# Patient Record
Sex: Male | Born: 1955 | Race: White | Hispanic: No | Marital: Married | Smoking: Never smoker
Health system: Southern US, Community
[De-identification: ages and names within clinical notes are randomized; demographics above are authoritative.]

## PROBLEM LIST (undated history)

## (undated) DIAGNOSIS — E042 Nontoxic multinodular goiter: Secondary | ICD-10-CM

## (undated) DIAGNOSIS — E041 Nontoxic single thyroid nodule: Secondary | ICD-10-CM

## (undated) DIAGNOSIS — C801 Malignant (primary) neoplasm, unspecified: Secondary | ICD-10-CM

## (undated) DIAGNOSIS — D869 Sarcoidosis, unspecified: Secondary | ICD-10-CM

## (undated) DIAGNOSIS — M1990 Unspecified osteoarthritis, unspecified site: Secondary | ICD-10-CM

## (undated) DIAGNOSIS — Z86718 Personal history of other venous thrombosis and embolism: Secondary | ICD-10-CM

## (undated) DIAGNOSIS — Z9109 Other allergy status, other than to drugs and biological substances: Secondary | ICD-10-CM

## (undated) DIAGNOSIS — R519 Headache, unspecified: Secondary | ICD-10-CM

## (undated) DIAGNOSIS — G4730 Sleep apnea, unspecified: Secondary | ICD-10-CM

## (undated) DIAGNOSIS — R05 Cough: Secondary | ICD-10-CM

## (undated) DIAGNOSIS — I10 Essential (primary) hypertension: Secondary | ICD-10-CM

## (undated) DIAGNOSIS — E119 Type 2 diabetes mellitus without complications: Secondary | ICD-10-CM

## (undated) DIAGNOSIS — L4050 Arthropathic psoriasis, unspecified: Secondary | ICD-10-CM

## (undated) DIAGNOSIS — R51 Headache: Secondary | ICD-10-CM

## (undated) DIAGNOSIS — R059 Cough, unspecified: Secondary | ICD-10-CM

## (undated) DIAGNOSIS — Q385 Congenital malformations of palate, not elsewhere classified: Secondary | ICD-10-CM

## (undated) HISTORY — DX: Arthropathic psoriasis, unspecified: L40.50

## (undated) HISTORY — PX: ANKLE SURGERY: SHX546

## (undated) HISTORY — PX: NASAL RECONSTRUCTION: SHX2069

## (undated) HISTORY — DX: Other allergy status, other than to drugs and biological substances: Z91.09

## (undated) SURGERY — BRONCHOSCOPY, WITH FLUOROSCOPY
Anesthesia: General

---

## 2008-04-21 ENCOUNTER — Emergency Department: Payer: Self-pay | Admitting: Emergency Medicine

## 2008-05-04 ENCOUNTER — Emergency Department: Payer: Self-pay | Admitting: Emergency Medicine

## 2008-12-19 ENCOUNTER — Inpatient Hospital Stay: Payer: Self-pay | Admitting: Internal Medicine

## 2015-05-22 ENCOUNTER — Encounter: Payer: Self-pay | Admitting: Radiology

## 2015-05-22 ENCOUNTER — Ambulatory Visit
Admission: RE | Admit: 2015-05-22 | Discharge: 2015-05-22 | Disposition: A | Discharge status: Home / Self Care | Payer: PRIVATE HEALTH INSURANCE | Source: Ambulatory Visit | Attending: Internal Medicine | Admitting: Internal Medicine

## 2015-05-22 ENCOUNTER — Other Ambulatory Visit: Payer: Self-pay | Admitting: Internal Medicine

## 2015-05-22 DIAGNOSIS — R0600 Dyspnea, unspecified: Secondary | ICD-10-CM

## 2015-05-22 DIAGNOSIS — R911 Solitary pulmonary nodule: Secondary | ICD-10-CM | POA: Diagnosis not present

## 2015-05-22 DIAGNOSIS — K8020 Calculus of gallbladder without cholecystitis without obstruction: Secondary | ICD-10-CM | POA: Diagnosis not present

## 2015-05-22 DIAGNOSIS — R05 Cough: Secondary | ICD-10-CM | POA: Diagnosis present

## 2015-05-22 DIAGNOSIS — R059 Cough, unspecified: Secondary | ICD-10-CM

## 2015-05-22 DIAGNOSIS — R590 Localized enlarged lymph nodes: Secondary | ICD-10-CM | POA: Insufficient documentation

## 2015-05-22 HISTORY — DX: Essential (primary) hypertension: I10

## 2015-05-22 MED ORDER — IOHEXOL 350 MG/ML SOLN
100.0000 mL | Freq: Once | INTRAVENOUS | Status: AC | PRN
  Administered 2015-05-22: 100 mL via INTRAVENOUS

## 2015-06-03 ENCOUNTER — Other Ambulatory Visit: Payer: Self-pay | Admitting: Internal Medicine

## 2015-06-03 DIAGNOSIS — R591 Generalized enlarged lymph nodes: Secondary | ICD-10-CM

## 2015-06-03 DIAGNOSIS — R634 Abnormal weight loss: Secondary | ICD-10-CM

## 2015-06-03 DIAGNOSIS — R599 Enlarged lymph nodes, unspecified: Secondary | ICD-10-CM

## 2015-06-06 ENCOUNTER — Encounter: Payer: Self-pay | Admitting: Pulmonary Disease

## 2015-06-06 ENCOUNTER — Ambulatory Visit (INDEPENDENT_AMBULATORY_CARE_PROVIDER_SITE_OTHER): Payer: PRIVATE HEALTH INSURANCE | Admitting: Pulmonary Disease

## 2015-06-06 ENCOUNTER — Other Ambulatory Visit (INDEPENDENT_AMBULATORY_CARE_PROVIDER_SITE_OTHER): Payer: PRIVATE HEALTH INSURANCE

## 2015-06-06 VITALS — BP 136/68 | HR 101 | Ht 68.0 in | Wt 194.0 lb

## 2015-06-06 DIAGNOSIS — R938 Abnormal findings on diagnostic imaging of other specified body structures: Secondary | ICD-10-CM | POA: Diagnosis not present

## 2015-06-06 DIAGNOSIS — R9389 Abnormal findings on diagnostic imaging of other specified body structures: Secondary | ICD-10-CM | POA: Insufficient documentation

## 2015-06-06 DIAGNOSIS — R05 Cough: Secondary | ICD-10-CM | POA: Diagnosis not present

## 2015-06-06 DIAGNOSIS — R059 Cough, unspecified: Secondary | ICD-10-CM

## 2015-06-06 LAB — CBC WITH DIFFERENTIAL/PLATELET
BASOS ABS: 0 10*3/uL (ref 0.0–0.1)
BASOS PCT: 0.2 % (ref 0.0–3.0)
EOS ABS: 0.2 10*3/uL (ref 0.0–0.7)
Eosinophils Relative: 3 % (ref 0.0–5.0)
HCT: 43.9 % (ref 39.0–52.0)
HEMOGLOBIN: 14.9 g/dL (ref 13.0–17.0)
Lymphocytes Relative: 13.4 % (ref 12.0–46.0)
Lymphs Abs: 1 10*3/uL (ref 0.7–4.0)
MCHC: 33.9 g/dL (ref 30.0–36.0)
MCV: 83.9 fl (ref 78.0–100.0)
MONO ABS: 1.1 10*3/uL — AB (ref 0.1–1.0)
Monocytes Relative: 14.3 % — ABNORMAL HIGH (ref 3.0–12.0)
NEUTROS ABS: 5.3 10*3/uL (ref 1.4–7.7)
Neutrophils Relative %: 69.1 % (ref 43.0–77.0)
PLATELETS: 239 10*3/uL (ref 150.0–400.0)
RBC: 5.23 Mil/uL (ref 4.22–5.81)
RDW: 14.4 % (ref 11.5–15.5)
WBC: 7.7 10*3/uL (ref 4.0–10.5)

## 2015-06-06 NOTE — Patient Instructions (Signed)
We will arrange for a bronchoscopy on Monday 11:30 at [facility] Do not eat anything after midnight on Sunday Keep taking your cough medications as you're doing We will see you back in 4-6 weeks or sooner if needed

## 2015-06-06 NOTE — Progress Notes (Signed)
Subjective:    Patient ID: Jimmy Macdonald, male    DOB: [birth year], 59 y.o.   MRN: [id number]  HPI Chief Complaint  Patient presents with  . Advice Only    Self referral for prod cough with clear/white mucus, unexplained weight loss X4 mos.  s/s presented after helping son move some rock/dirt into a chicken enclosement.     Cough started in July.  He was working with his son and cleaning out his chicken house.  This lead to a cough.  He started with just an occasional cough which was every once in a while but it has progressed since then. He has been on Augmentin twice, levaquin, and prednisone a couple of times.  He has had progressive symptoms of cough.    He ended up in the ER at [facility] having a CT scan of his chest.  He had a pulmonary nodule.  Since then he was treated with another round of antibiotics but this hasn't helped.  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  He still has a severe cough and takes tussionex at night and continues to cough.  He feels fatigued nearly all the time.  He sometimes coughs up hard chunks of mucus.  He also feels like he has a low grade fever nearly all the time.  He has a low grade headache a lot.    Prednisone helped his energy level a little.  He has no GI complaints.  No energy.  He has no appetite right now.    He has psoriatic arthritis and has been on humira for that.  His dose has been reduced recently.  He started it in 5 years ago. He has not taken the humira since July however when he had the cough.   Past Medical History  Diagnosis Date  . Hypertension   . Environmental allergies   . Psoriatic arthritis (HCC)      Family History  Problem Relation Age of Onset  . Hypercholesterolemia Brother   . Lung disease Mother      Social History   Social History  . Marital Status: Married    Spouse Name: N/A  . Number of Children: N/A  . Years of Education: N/A   Occupational History  . Not on file.   Social History  Main Topics  . Smoking status: Never Smoker   . Smokeless tobacco: Never Used  . Alcohol Use: Not on file  . Drug Use: Not on file  . Sexual Activity: Not on file   Other Topics Concern  . Not on file   Social History Narrative     Allergies  Allergen Reactions  . Ibuprofen Rash     No outpatient prescriptions prior to visit.   No facility-administered medications prior to visit.        Review of Systems  Constitutional: Positive for unexpected weight change. Negative for fever.  HENT: Positive for congestion and sinus pressure. Negative for dental problem, ear pain, nosebleeds, postnasal drip, rhinorrhea, sneezing, sore throat and trouble swallowing.   Eyes: Negative for redness and itching.  Respiratory: Positive for cough and shortness of breath. Negative for chest tightness and wheezing.   Cardiovascular: Negative for palpitations and leg swelling.  Gastrointestinal: Negative for nausea and vomiting.  Genitourinary: Negative for dysuria.  Musculoskeletal: Negative for joint swelling.  Skin: Negative for rash.  Neurological: Positive for headaches.  Hematological: Does not bruise/bleed easily.  Psychiatric/Behavioral: Negative for dysphoric mood. The  patient is not nervous/anxious.        Objective:   Physical Exam Filed Vitals:   06/06/15 1537  BP: 136/68  Pulse: 101  Height: 5\' 8"  (1.727 m)  Weight: 194 lb (87.998 kg)  SpO2: 96%   RA  Gen: well appearing, no acute distress HENT: NCAT, OP clear, neck supple without masses Eyes: PERRL, EOMi Lymph: no cervical lymphadenopathy PULM: Crackles R base B CV: RRR, no mgr, no JVD GI: BS+, soft, nontender, no hsm Derm: no rash or skin breakdown MSK: normal bulk and tone Neuro: A&Ox4, CN II-XII intact, strength 5/5 in all 4 extremities Psyche: normal mood and affect   CT scan images personally reviewed, see my description below Records from his primary care physician were reviewed including his medication  list as detailed above.     Assessment & Plan:  Abnormal CT scan, chest Mr. Darnelle SpangleGrinstead has a CT scan which shows a pulmonary nodule in the right lower lobe, mediastinal lymphadenopathy, and dependent appearing groundglass. This has been associated with a cough ever since when he was exposed to the chicken coop in July. Since then he's had off and on exposure to it as it is in his yard. I am concerned considering his immunocompromise state this represents an infectious syndrome of some sort. Clearly, most bacteria have been treated multiple times with his 5-6 rounds of various antibiotics since July. I'm more concerned about the possibility of an atypical fungal infection or mycobacterial infection considering the fact that he uses Humira for his psoriatic arthritis. There is also a possibility that this may be an inflammatory condition as he has backed off on the Humira in the last several months. Specifically, hypersensitivity pneumonitis comes to mind versus another nonspecific interstitial pneumonitis. He has a low-grade fever which is nonspecific and could be associated with both a systemic infection versus an inflammatory state. So in summary the differential diagnosis here is quite broad.  Plan: Blood work today to look for evidence of infection (serum cryptococcal antigen, urine histo antigen) connective tissue disease panel, hypersensitivity panel Plan [facility] we will send for an extended immunocompromised infectious panel (AFB, fungal, bacterial, viral, PCP) and we will send a cell count with differential and cytology CBC with differential and serum IgE today   Cough As detailed above. Clearly lisinopril is not helping. He has are had a trial off of that. For now we'll maintain the lisinopril.  however, do not think it's helping. For now we will assess with the CT scan as detailed above.      Current outpatient prescriptions:  .  aspirin 81 MG  tablet, Take 81 mg by mouth daily., Disp: , Rfl:  .  chlorpheniramine-HYDROcodone (TUSSIONEX PENNKINETIC ER) 10-8 MG/5ML SUER, Take 5 mLs by mouth at bedtime as needed for cough., Disp: , Rfl:  .  levofloxacin (LEVAQUIN) 750 MG tablet, Take 750 mg by mouth daily., Disp: , Rfl:  .  lisinopril-hydrochlorothiazide (PRINZIDE,ZESTORETIC) 20-12.5 MG tablet, Take 1 tablet by mouth daily., Disp: , Rfl:  .  loratadine (CLARITIN) 10 MG tablet, Take 10 mg by mouth daily., Disp: , Rfl:  .  traMADol (ULTRAM) 50 MG tablet, Take 50 mg by mouth daily as needed., Disp: , Rfl:

## 2015-06-06 NOTE — Assessment & Plan Note (Signed)
[name] has a CT scan which shows a pulmonary nodule in the right lower lobe, mediastinal lymphadenopathy, and dependent appearing groundglass. This has been associated with a cough ever since when he was exposed to the chicken coop in July. Since then he's had off and on exposure to it as it is in his yard. I am concerned considering his immunocompromise state this represents an infectious syndrome of some sort. Clearly, most bacteria have been treated multiple times with his 5-6 rounds of various antibiotics since July. I'm more concerned about the possibility of an atypical fungal infection or mycobacterial infection considering the fact that he uses Humira for his psoriatic arthritis. There is also a possibility that this may be an inflammatory condition as he has backed off on the Humira in the last several months. Specifically, hypersensitivity pneumonitis comes to mind versus another nonspecific interstitial pneumonitis. He has a low-grade fever which is nonspecific and could be associated with both a systemic infection versus an inflammatory state. So in summary the differential diagnosis here is quite broad.  Plan: Blood work today to look for evidence of infection (serum cryptococcal antigen, urine histo antigen) connective tissue disease panel, hypersensitivity panel Plan [facility] we will send for an extended immunocompromised infectious panel (AFB, fungal, bacterial, viral, PCP) and we will send a cell count with differential and cytology CBC with differential and serum IgE today

## 2015-06-06 NOTE — Assessment & Plan Note (Signed)
As detailed above. Clearly lisinopril is not helping. He has are had a trial off of that. For now we'll maintain the lisinopril.  however, do not think it's helping. For now we will assess with the CT scan as detailed above.

## 2015-06-07 ENCOUNTER — Telehealth: Payer: Self-pay

## 2015-06-07 LAB — SJOGRENS SYNDROME-B EXTRACTABLE NUCLEAR ANTIBODY: SSB (La) (ENA) Antibody, IgG: <1

## 2015-06-07 LAB — SJOGRENS SYNDROME-A EXTRACTABLE NUCLEAR ANTIBODY: SSA (Ro) (ENA) Antibody, IgG: <1

## 2015-06-07 LAB — ANCA SCREEN W REFLEX TITER: ANCA Screen: NEGATIVE

## 2015-06-07 LAB — IGE: IgE (Immunoglobulin E), Serum: 251 kU/L — ABNORMAL HIGH (ref <115)

## 2015-06-07 NOTE — Telephone Encounter (Signed)
Spoke with BQ this morning, states that the bronchoscopy that was schedule with pt for [facility] at 12[facility]00 needs to be cancelled and changed to an [facility].  This has already been scheduled for Thursday 06/13/2015 at 7[facility].    lmtcb X1 on home # to make pt aware.   Spoke with pt on work #, he is aware of change in procedure date/time/location.  Nothing further needed.

## 2015-06-10 ENCOUNTER — Encounter (HOSPITAL_COMMUNITY): Admission: RE | Payer: Self-pay | Source: Ambulatory Visit

## 2015-06-10 ENCOUNTER — Ambulatory Visit (HOSPITAL_COMMUNITY)
Admission: RE | Admit: 2015-06-10 | Payer: PRIVATE HEALTH INSURANCE | Source: Ambulatory Visit | Admitting: Pulmonary Disease

## 2015-06-10 ENCOUNTER — Inpatient Hospital Stay (HOSPITAL_COMMUNITY): Admission: RE | Admit: 2015-06-10 | Payer: PRIVATE HEALTH INSURANCE | Source: Ambulatory Visit

## 2015-06-10 SURGERY — BRONCHOSCOPY, WITH FLUOROSCOPY
Anesthesia: Moderate Sedation | Laterality: Bilateral

## 2015-06-12 ENCOUNTER — Encounter (HOSPITAL_COMMUNITY)
Admission: RE | Admit: 2015-06-12 | Discharge: 2015-06-12 | Disposition: A | Discharge status: Home / Self Care | Payer: PRIVATE HEALTH INSURANCE | Source: Ambulatory Visit | Attending: Pulmonary Disease | Admitting: Pulmonary Disease

## 2015-06-12 ENCOUNTER — Encounter (HOSPITAL_COMMUNITY): Payer: Self-pay

## 2015-06-12 ENCOUNTER — Other Ambulatory Visit: Payer: Self-pay

## 2015-06-12 DIAGNOSIS — J8410 Pulmonary fibrosis, unspecified: Secondary | ICD-10-CM | POA: Diagnosis not present

## 2015-06-12 DIAGNOSIS — I10 Essential (primary) hypertension: Secondary | ICD-10-CM | POA: Diagnosis not present

## 2015-06-12 DIAGNOSIS — R590 Localized enlarged lymph nodes: Secondary | ICD-10-CM | POA: Diagnosis present

## 2015-06-12 DIAGNOSIS — M1990 Unspecified osteoarthritis, unspecified site: Secondary | ICD-10-CM | POA: Diagnosis not present

## 2015-06-12 DIAGNOSIS — L4050 Arthropathic psoriasis, unspecified: Secondary | ICD-10-CM | POA: Diagnosis not present

## 2015-06-12 HISTORY — DX: Headache, unspecified: R51.9

## 2015-06-12 HISTORY — DX: Cough: R05

## 2015-06-12 HISTORY — DX: Headache: R51

## 2015-06-12 HISTORY — DX: Personal history of other venous thrombosis and embolism: Z86.718

## 2015-06-12 HISTORY — DX: Cough, unspecified: R05.9

## 2015-06-12 HISTORY — DX: Unspecified osteoarthritis, unspecified site: M19.90

## 2015-06-12 HISTORY — DX: Nontoxic single thyroid nodule: E04.1

## 2015-06-12 LAB — CBC
HEMATOCRIT: 42 % (ref 39.0–52.0)
HEMOGLOBIN: 14.3 g/dL (ref 13.0–17.0)
MCH: 28.7 pg (ref 26.0–34.0)
MCHC: 34 g/dL (ref 30.0–36.0)
MCV: 84.2 fL (ref 78.0–100.0)
Platelets: 220 10*3/uL (ref 150–400)
RBC: 4.99 MIL/uL (ref 4.22–5.81)
RDW: 13.6 % (ref 11.5–15.5)
WBC: 6.5 10*3/uL (ref 4.0–10.5)

## 2015-06-12 LAB — BASIC METABOLIC PANEL
Anion gap: 11 (ref 5–15)
BUN: 12 mg/dL (ref 6–20)
CALCIUM: 9.6 mg/dL (ref 8.9–10.3)
CO2: 27 mmol/L (ref 22–32)
CREATININE: 0.87 mg/dL (ref 0.61–1.24)
Chloride: 99 mmol/L — ABNORMAL LOW (ref 101–111)
GFR calc non Af Amer: >60 mL/min (ref >60)
Glucose, Bld: 134 mg/dL — ABNORMAL HIGH (ref 65–99)
Potassium: 4.5 mmol/L (ref 3.5–5.1)
SODIUM: 137 mmol/L (ref 135–145)

## 2015-06-12 NOTE — Progress Notes (Signed)
SPOKE WITH LIBBY AT DR. Corey SkainsMCQUAIDS OFFICE RE:NOT HAVING ORDERS.  PAGED DR. Kendrick FriesMCQUAID, NO ANSWER.

## 2015-06-12 NOTE — Pre-Procedure Instructions (Signed)
Jimmy RushingBarry L Macdonald  06/12/2015      [facility] [place], [place] - [address] [place] [id number] Phone: [phone] Fax: [phone]    Your procedure is scheduled on    Thursday  06/13/15  [facility] Admitting at 530 A.M.  Call this number if you have problems the morning of surgery:  [phone]   Remember:  Do not eat food or drink liquids after midnight.  Take these medicines the morning of surgery with A SIP OF WATER   NONE  (STOP ASPIRIN)   Do not wear jewelry, make-up or nail polish.  Do not wear lotions, powders, or perfumes.  You may wear deodorant.  Do not shave 48 hours prior to surgery.  Men may shave face and neck.  Do not bring valuables to the hospital.  [facility] is not responsible for any belongings or valuables.  Contacts, dentures or bridgework may not be worn into surgery.  Leave your suitcase in the car.  After surgery it may be brought to your room.  For patients admitted to the hospital, discharge time will be determined by your treatment team.  Patients discharged the day of surgery will not be allowed to drive home.   Name and phone number of your driver:   SPOUSE Special instructions:   [place] - Preparing for Surgery  Before surgery, you can play an important role.  Because skin is not sterile, your skin needs to be as free of germs as possible.  You can reduce the number of germs on you skin by washing with CHG (chlorahexidine gluconate) soap before surgery.  CHG is an antiseptic cleaner which kills germs and bonds with the skin to continue killing germs even after washing.  Please DO NOT use if you have an allergy to CHG or antibacterial soaps.  If your skin becomes reddened/irritated stop using the CHG and inform your nurse when you arrive at Short Stay.  Do not shave (including legs and underarms) for at least 48 hours prior to the first CHG shower.  You may shave your face.  Please follow  these instructions carefully:   1.  Shower with CHG Soap the night before surgery and the                                morning of Surgery.  2.  If you choose to wash your hair, wash your hair first as usual with your       normal shampoo.  3.  After you shampoo, rinse your hair and body thoroughly to remove the                      Shampoo.  4.  Use CHG as you would any other liquid soap.  You can apply chg directly       to the skin and wash gently with scrungie or a clean washcloth.  5.  Apply the CHG Soap to your body ONLY FROM THE NECK DOWN.        Do not use on open wounds or open sores.  Avoid contact with your eyes,       ears, mouth and genitals (private parts).  Wash genitals (private parts)       with your normal soap.  6.  Wash thoroughly, paying special attention to the area where your  surgery        will be performed.  7.  Thoroughly rinse your body with warm water from the neck down.  8.  DO NOT shower/wash with your normal soap after using and rinsing off       the CHG Soap.  9.  Pat yourself dry with a clean towel.            10.  Wear clean pajamas.            11.  Place clean sheets on your bed the night of your first shower and do not        sleep with pets.  Day of Surgery  Do not apply any lotions/deoderants the morning of surgery.  Please wear clean clothes to the hospital/surgery center.    Please read over the following fact sheets that you were given. Pain Booklet, Coughing and Deep Breathing and Surgical Site Infection Prevention

## 2015-06-13 ENCOUNTER — Ambulatory Visit (HOSPITAL_COMMUNITY): Payer: PRIVATE HEALTH INSURANCE | Admitting: Anesthesiology

## 2015-06-13 ENCOUNTER — Ambulatory Visit (HOSPITAL_COMMUNITY): Payer: PRIVATE HEALTH INSURANCE

## 2015-06-13 ENCOUNTER — Encounter (HOSPITAL_COMMUNITY)
Admission: RE | Disposition: A | Discharge status: Home / Self Care | Payer: Self-pay | Source: Ambulatory Visit | Attending: Pulmonary Disease

## 2015-06-13 ENCOUNTER — Ambulatory Visit: Payer: PRIVATE HEALTH INSURANCE

## 2015-06-13 ENCOUNTER — Ambulatory Visit (HOSPITAL_COMMUNITY)
Admission: RE | Admit: 2015-06-13 | Discharge: 2015-06-13 | Disposition: A | Discharge status: Home / Self Care | Payer: PRIVATE HEALTH INSURANCE | Source: Ambulatory Visit | Attending: Pulmonary Disease | Admitting: Pulmonary Disease

## 2015-06-13 ENCOUNTER — Encounter (HOSPITAL_COMMUNITY): Payer: Self-pay | Admitting: *Deleted

## 2015-06-13 DIAGNOSIS — R938 Abnormal findings on diagnostic imaging of other specified body structures: Secondary | ICD-10-CM

## 2015-06-13 DIAGNOSIS — Z9889 Other specified postprocedural states: Secondary | ICD-10-CM | POA: Diagnosis not present

## 2015-06-13 DIAGNOSIS — R059 Cough, unspecified: Secondary | ICD-10-CM

## 2015-06-13 DIAGNOSIS — R9389 Abnormal findings on diagnostic imaging of other specified body structures: Secondary | ICD-10-CM

## 2015-06-13 DIAGNOSIS — J8410 Pulmonary fibrosis, unspecified: Secondary | ICD-10-CM | POA: Diagnosis not present

## 2015-06-13 DIAGNOSIS — R05 Cough: Secondary | ICD-10-CM | POA: Diagnosis not present

## 2015-06-13 DIAGNOSIS — D869 Sarcoidosis, unspecified: Secondary | ICD-10-CM

## 2015-06-13 DIAGNOSIS — M1990 Unspecified osteoarthritis, unspecified site: Secondary | ICD-10-CM | POA: Insufficient documentation

## 2015-06-13 DIAGNOSIS — Z419 Encounter for procedure for purposes other than remedying health state, unspecified: Secondary | ICD-10-CM

## 2015-06-13 DIAGNOSIS — L4050 Arthropathic psoriasis, unspecified: Secondary | ICD-10-CM | POA: Insufficient documentation

## 2015-06-13 DIAGNOSIS — I10 Essential (primary) hypertension: Secondary | ICD-10-CM | POA: Insufficient documentation

## 2015-06-13 HISTORY — PX: VIDEO BRONCHOSCOPY WITH ENDOBRONCHIAL ULTRASOUND: SHX6177

## 2015-06-13 LAB — BODY FLUID CELL COUNT WITH DIFFERENTIAL
Eos, Fluid: 1 %
LYMPHS FL: 52 %
Monocyte-Macrophage-Serous Fluid: 9 % — ABNORMAL LOW (ref 50–90)
NEUTROPHIL FLUID: 38 % — AB (ref 0–25)
WBC FLUID: 230 uL (ref 0–1000)

## 2015-06-13 SURGERY — BRONCHOSCOPY, WITH EBUS
Anesthesia: General

## 2015-06-13 MED ORDER — MIDAZOLAM HCL 5 MG/5ML IJ SOLN
INTRAMUSCULAR | Status: DC | PRN
  Administered 2015-06-13: 2 mg via INTRAVENOUS

## 2015-06-13 MED ORDER — SODIUM CHLORIDE 0.9 % IV SOLN
10.0000 mg | INTRAVENOUS | Status: DC | PRN
  Administered 2015-06-13: 10 ug/min via INTRAVENOUS

## 2015-06-13 MED ORDER — LIDOCAINE HCL (CARDIAC) 20 MG/ML IV SOLN
INTRAVENOUS | Status: DC | PRN
  Administered 2015-06-13: 40 mg via INTRAVENOUS
  Administered 2015-06-13: 60 mg via INTRAVENOUS

## 2015-06-13 MED ORDER — NEOSTIGMINE METHYLSULFATE 10 MG/10ML IV SOLN
INTRAVENOUS | Status: AC
  Filled 2015-06-13: qty 1

## 2015-06-13 MED ORDER — ROCURONIUM BROMIDE 100 MG/10ML IV SOLN
INTRAVENOUS | Status: DC | PRN
  Administered 2015-06-13: 50 mg via INTRAVENOUS

## 2015-06-13 MED ORDER — LACTATED RINGERS IV SOLN
INTRAVENOUS | Status: DC | PRN
  Administered 2015-06-13: 07:00:00 via INTRAVENOUS

## 2015-06-13 MED ORDER — ROCURONIUM BROMIDE 50 MG/5ML IV SOLN
INTRAVENOUS | Status: AC
  Filled 2015-06-13: qty 1

## 2015-06-13 MED ORDER — PROPOFOL 10 MG/ML IV BOLUS
INTRAVENOUS | Status: DC | PRN
  Administered 2015-06-13: 160 mg via INTRAVENOUS

## 2015-06-13 MED ORDER — GLYCOPYRROLATE 0.2 MG/ML IJ SOLN
INTRAMUSCULAR | Status: AC
  Filled 2015-06-13: qty 2

## 2015-06-13 MED ORDER — 0.9 % SODIUM CHLORIDE (POUR BTL) OPTIME
TOPICAL | Status: DC | PRN
  Administered 2015-06-13: 1000 mL

## 2015-06-13 MED ORDER — GLYCOPYRROLATE 0.2 MG/ML IJ SOLN
INTRAMUSCULAR | Status: DC | PRN
  Administered 2015-06-13: 0.4 mg via INTRAVENOUS

## 2015-06-13 MED ORDER — LIDOCAINE HCL 2 % EX GEL
1.0000 "application " | Freq: Once | CUTANEOUS | Status: DC
  Filled 2015-06-13: qty 5

## 2015-06-13 MED ORDER — MIDAZOLAM HCL 2 MG/2ML IJ SOLN
INTRAMUSCULAR | Status: AC
  Filled 2015-06-13: qty 4

## 2015-06-13 MED ORDER — ONDANSETRON HCL 4 MG/2ML IJ SOLN
INTRAMUSCULAR | Status: AC
  Filled 2015-06-13: qty 2

## 2015-06-13 MED ORDER — NEOSTIGMINE METHYLSULFATE 10 MG/10ML IV SOLN
INTRAVENOUS | Status: DC | PRN
  Administered 2015-06-13: 3 mg via INTRAVENOUS

## 2015-06-13 MED ORDER — FENTANYL CITRATE (PF) 100 MCG/2ML IJ SOLN
INTRAMUSCULAR | Status: DC | PRN
  Administered 2015-06-13: 100 ug via INTRAVENOUS

## 2015-06-13 MED ORDER — LIDOCAINE HCL (CARDIAC) 20 MG/ML IV SOLN
INTRAVENOUS | Status: AC
  Filled 2015-06-13: qty 5

## 2015-06-13 MED ORDER — PROPOFOL 10 MG/ML IV BOLUS
INTRAVENOUS | Status: AC
  Filled 2015-06-13: qty 20

## 2015-06-13 MED ORDER — FENTANYL CITRATE (PF) 250 MCG/5ML IJ SOLN
INTRAMUSCULAR | Status: AC
  Filled 2015-06-13: qty 5

## 2015-06-13 MED ORDER — ONDANSETRON HCL 4 MG/2ML IJ SOLN
INTRAMUSCULAR | Status: DC | PRN
  Administered 2015-06-13: 4 mg via INTRAVENOUS

## 2015-06-13 SURGICAL SUPPLY — 28 items
BRUSH CYTOL CELLEBRITY 1.5X140 (MISCELLANEOUS) IMPLANT
CANISTER SUCTION 2500CC (MISCELLANEOUS) ×2 IMPLANT
CONT SPEC 4OZ CLIKSEAL STRL BL (MISCELLANEOUS) ×2 IMPLANT
COVER DOME SNAP 22 D (MISCELLANEOUS) ×2 IMPLANT
COVER TABLE BACK 60X90 (DRAPES) ×2 IMPLANT
FORCEPS BIOP RJ4 1.8 (CUTTING FORCEPS) ×2 IMPLANT
GAUZE SPONGE 4X4 12PLY STRL (GAUZE/BANDAGES/DRESSINGS) ×2 IMPLANT
GLOVE BIO SURGEON STRL SZ8 (GLOVE) ×2 IMPLANT
GLOVE SURG SS PI 7.0 STRL IVOR (GLOVE) ×2 IMPLANT
GOWN STRL REUS W/ TWL LRG LVL3 (GOWN DISPOSABLE) ×1 IMPLANT
GOWN STRL REUS W/ TWL XL LVL3 (GOWN DISPOSABLE) ×1 IMPLANT
GOWN STRL REUS W/TWL LRG LVL3 (GOWN DISPOSABLE) ×1
GOWN STRL REUS W/TWL XL LVL3 (GOWN DISPOSABLE) ×1
KIT CLEAN ENDO COMPLIANCE (KITS) ×4 IMPLANT
KIT ROOM TURNOVER OR (KITS) ×2 IMPLANT
MARKER SKIN DUAL TIP RULER LAB (MISCELLANEOUS) ×2 IMPLANT
NEEDLE BIOPSY TRANSBRONCH 21G (NEEDLE) IMPLANT
NEEDLE ECHOTIP HI DEF 22GA (NEEDLE) ×2 IMPLANT
NEEDLE SONO TIP II EBUS (NEEDLE) IMPLANT
NS IRRIG 1000ML POUR BTL (IV SOLUTION) ×2 IMPLANT
OIL SILICONE PENTAX (PARTS (SERVICE/REPAIRS)) ×2 IMPLANT
PAD ARMBOARD 7.5X6 YLW CONV (MISCELLANEOUS) ×4 IMPLANT
SYR 20CC LL (SYRINGE) ×2 IMPLANT
SYR 20ML ECCENTRIC (SYRINGE) ×4 IMPLANT
SYR 5ML LUER SLIP (SYRINGE) ×2 IMPLANT
TOWEL OR 17X24 6PK STRL BLUE (TOWEL DISPOSABLE) ×2 IMPLANT
TRAP SPECIMEN MUCOUS 40CC (MISCELLANEOUS) ×2 IMPLANT
TUBE CONNECTING 20X1/4 (TUBING) ×2 IMPLANT

## 2015-06-13 NOTE — Anesthesia Procedure Notes (Signed)
Procedure Name: Intubation Date/Time: 06/13/2015 7:34 AM Performed by: Kyung Rudd Pre-anesthesia Checklist: Patient identified, Emergency Drugs available, Suction available, Patient being monitored and Timeout performed Patient Re-evaluated:Patient Re-evaluated prior to inductionOxygen Delivery Method: Circle system utilized Preoxygenation: Pre-oxygenation with 100% oxygen Intubation Type: IV induction Ventilation: Mask ventilation without difficulty Laryngoscope Size: Mac and 4 Grade View: Grade I Tube type: Oral Tube size: 8.5 mm Number of attempts: 1 Airway Equipment and Method: Stylet and LTA kit utilized Placement Confirmation: ETT inserted through vocal cords under direct vision,  positive ETCO2 and breath sounds checked- equal and bilateral Secured at: 22 cm Tube secured with: Tape Dental Injury: Teeth and Oropharynx as per pre-operative assessment

## 2015-06-13 NOTE — Anesthesia Preprocedure Evaluation (Addendum)
Anesthesia Evaluation  Patient identified by MRN, date of birth, ID band Patient awake    Reviewed: Allergy & Precautions, NPO status   History of Anesthesia Complications Negative for: history of anesthetic complications  Airway Mallampati: II  TM Distance: >3 FB Neck ROM: Full    Dental  (+) Teeth Intact   Pulmonary neg pulmonary ROS,    breath sounds clear to auscultation       Cardiovascular hypertension, Pt. on medications  Rhythm:Regular Rate:Normal     Neuro/Psych  Headaches,    GI/Hepatic negative GI ROS, Neg liver ROS,   Endo/Other  negative endocrine ROS  Renal/GU negative Renal ROS     Musculoskeletal  (+) Arthritis ,   Abdominal   Peds  Hematology negative hematology ROS (+)   Anesthesia Other Findings   Reproductive/Obstetrics                            Anesthesia Physical Anesthesia Plan  ASA: II  Anesthesia Plan: General   Post-op Pain Management:    Induction: Intravenous  Airway Management Planned: Oral ETT  Additional Equipment:   Intra-op Plan:   Post-operative Plan: Extubation in OR  Informed Consent: I have reviewed the patients History and Physical, chart, labs and discussed the procedure including the risks, benefits and alternatives for the proposed anesthesia with the patient or authorized representative who has indicated his/her understanding and acceptance.   Dental advisory given  Plan Discussed with: CRNA and Surgeon  Anesthesia Plan Comments:         Anesthesia Quick Evaluation

## 2015-06-13 NOTE — H&P (Signed)
  [facility]  H&P  HPI: [name] has dealt with a cough and low grade fever for several months after exposure to a chicken coop.  He has an abnormal CT chest showing diffuse ground glass and lymphadenopathy.  He has not recovered despite multiple rounds of antibiotics and steroids.  He is here today for a bronchoscopy for further evaluation.  Past Medical History  Diagnosis Date  . Hypertension   . Environmental allergies   . Psoriatic arthritis (HCC)   . Hx of blood clots     RT LEG  10 YRS AGO  . Cough   . Thyroid nodule     BENIGN   . Headache     SINUS  . Arthritis      Family History  Problem Relation Age of Onset  . Hypercholesterolemia Brother   . Lung disease Mother      Social History   Social History  . Marital Status: Married    Spouse Name: N/A  . Number of Children: N/A  . Years of Education: N/A   Occupational History  . Not on file.   Social History Main Topics  . Smoking status: Never Smoker   . Smokeless tobacco: Never Used  . Alcohol Use: No  . Drug Use: No  . Sexual Activity: Not on file   Other Topics Concern  . Not on file   Social History Narrative     Allergies  Allergen Reactions  . Ibuprofen Rash    Stomach upset     @encmedstart @   Filed Vitals:   06/13/15 0555  BP: 103/70  Pulse: 84  Temp: 98.2 F (36.8 C)  TempSrc: Oral  Resp: 20  Weight: 88.451 kg (195 lb)  SpO2: 97%   Gen: well appearing HENT: OP clear, TM's clear, neck supple PULM: CTA B, normal percussion CV: RRR, no mgr, trace edema GI: BS+, soft, nontender Derm: no cyanosis or rash Psyche: normal mood and affect  CBC    Component Value Date/Time   WBC 6.5 06/12/2015 1320   RBC 4.99 06/12/2015 1320   HGB 14.3 06/12/2015 1320   HCT 42.0 06/12/2015 1320   PLT 220 06/12/2015 1320   MCV 84.2 06/12/2015 1320   MCH 28.7 06/12/2015 1320   MCHC 34.0 06/12/2015 1320   RDW 13.6 06/12/2015 1320   LYMPHSABS 1.0 06/06/2015 1641   MONOABS 1.1* 06/06/2015  1641   EOSABS 0.2 06/06/2015 1641   BASOSABS 0.0 06/06/2015 1641    CT images personally reviewed again, bilateral ground glass upper lobes  Impression/Plan: Abnormal CT chest, elevated serum IgE after chicken coop exposure. DDx broad here given baseline use of biologic therapy (humira) for his psoriatic arthritis.  NSIP, hypersensitivity pneumonitis, atypical infection, eosinophilic pneumonia all possible. -EBUS for FNA of lymph nodes -BAL -transbronchial biopsy  Risks and benefits discussed, he is willing to proceed  Jimmy CarolinaBrent McQuaid, MD [place] [facility] Pager: [phone] Cell: [phone] After 3pm or if no response, call [phone]

## 2015-06-13 NOTE — Op Note (Signed)
Video Bronchoscopy with Endobronchial Ultrasound Procedure Note  Date of Operation: 06/13/2015  Pre-op Diagnosis: Abnormal CT chest with mediastinal lymphadenopathy, cough, immunocompromised  Post-op Diagnosis: Abnormal CT chest with mediastinal lymphadenopathy, cough, immunocompromised  Surgeon: Heber CarolinaBrent McQuaid  Assistants: none  Anesthesia: General endotracheal anesthesia  Operation: Flexible video fiberoptic bronchoscopy with endobronchial ultrasound and biopsies.  Estimated Blood Loss: Minimal  Complications: None immediate  Indications and History: Jimmy RushingBarry L Weisenberger is a 59 y.o. male with a cough, low grade fevers and abnormal CT chest.  The risks, benefits, complications, treatment options and expected outcomes were discussed with the patient.  The possibilities of pneumothorax, pneumonia, reaction to medication, pulmonary aspiration, perforation of a viscus, bleeding, failure to diagnose a condition and creating a complication requiring transfusion or operation were discussed with the patient who freely signed the consent.    Description of Procedure: The patient was examined in the preoperative area and history and data from the preprocedure consultation were reviewed. It was deemed appropriate to proceed.  The [facility], identified as Jimmy RushingBarry L Mcloud and the procedure verified as Flexible Video Fiberoptic Bronchoscopy.  A Time Out was held and the above information confirmed. After being taken to the operating room general anesthesia was initiated and the patient  was orally intubated. The video fiberoptic bronchoscope was introduced via the endotracheal tube and a general inspection was performed which showed normal airway anatomy without endobronchial lesions. The standard scope was then withdrawn and the endobronchial ultrasound was used to identify and characterize the peritracheal, hilar and bronchial lymph nodes. Inspection showed large  lymphadenopathy throughout the tracheobronchial tree in the hilar, subcarinal, and pre-tracheal areas bilaterally. Using real-time ultrasound guidance Wang needle biopsies were take from Station 7 nodes and were sent for cytology. The patient tolerated the procedure well without apparent complications. There was no significant blood loss. The bronchoscope was withdrawn and then the conventional bronchoscope was re-introduced into the airway.  Transbronchial biopsies and a broncho-alveolar lavage was taken from the right upper lobe anterior segment. Anesthesia was reversed and the patient was taken to the PACU for recovery.   Samples: 1. Wang needle biopsies from 7 node 2. Transbronchial biopsies from the right upper lobe anterior segment sent for routine histology 3. Broncho-alveolar lavage from the right upper lobe anterior segment > sent for cell count with diff, flow cytometry (CD4:CD8 ratio), routine cytology, culture (bacterial, fungal, AFB), and galactomannon.  Plans:  The patient will be discharged from the PACU to home when recovered from anesthesia. We will review the cytology, pathology and microbiology results with the patient when they become available. Outpatient followup will be with Dr. Kendrick FriesMcQuaid.   Heber CarolinaBrent McQuaid, MD [place] [facility] Pager: [phone] Cell: [phone] After 3pm or if no response, call [phone]

## 2015-06-13 NOTE — Anesthesia Postprocedure Evaluation (Signed)
  Anesthesia Post-op Note  Patient: Jimmy RushingBarry L Yandow  Procedure(s) Performed: Procedure(s): VIDEO BRONCHOSCOPY WITH ENDOBRONCHIAL ULTRASOUND (N/A)  Patient Location: PACU  Anesthesia Type:General  Level of Consciousness: awake and alert   Airway and Oxygen Therapy: Patient Spontanous Breathing  Post-op Pain: none  Post-op Assessment: Post-op Vital signs reviewed and Patient's Cardiovascular Status Stable              Post-op Vital Signs: stable  Last Vitals:  Filed Vitals:   06/13/15 0940  BP: 96/63  Pulse: 81  Temp: 36.6 C  Resp: 14    Complications: No apparent anesthesia complications

## 2015-06-13 NOTE — Progress Notes (Signed)
Called Dr Kendrick FriesMcQuaid regarding chest x ray results. Patient cleared to be discharged.

## 2015-06-13 NOTE — Transfer of Care (Signed)
Immediate Anesthesia Transfer of Care Note  Patient: Jimmy RushingBarry L Macdonald  Procedure(s) Performed: Procedure(s): VIDEO BRONCHOSCOPY WITH ENDOBRONCHIAL ULTRASOUND (N/A)  Patient Location: PACU  Anesthesia Type:General  Level of Consciousness: awake, alert  and oriented  Airway & Oxygen Therapy: Patient Spontanous Breathing and Patient connected to face mask oxygen  Post-op Assessment: Report given to RN, Post -op Vital signs reviewed and stable and Patient moving all extremities X 4  Post vital signs: Reviewed and stable  Last Vitals:  Filed Vitals:   06/13/15 0844  BP: 124/55  Pulse:   Temp: 37.1 C  Resp: 12    Complications: No apparent anesthesia complications

## 2015-06-14 ENCOUNTER — Encounter (HOSPITAL_COMMUNITY): Payer: Self-pay | Admitting: Pulmonary Disease

## 2015-06-15 LAB — CULTURE, RESPIRATORY W GRAM STAIN
Culture: NO GROWTH
Gram Stain: NONE SEEN

## 2015-06-17 ENCOUNTER — Telehealth: Payer: Self-pay | Admitting: Pulmonary Disease

## 2015-06-17 DIAGNOSIS — D869 Sarcoidosis, unspecified: Secondary | ICD-10-CM

## 2015-06-17 LAB — MISC LABCORP TEST (SEND OUT): LABCORP TEST CODE: 183805

## 2015-06-17 LAB — PNEUMOCYSTIS JIROVECI SMEAR BY DFA: Pneumocystis jiroveci Ag: NEGATIVE

## 2015-06-17 NOTE — Telephone Encounter (Signed)
BQ did you need to speak to this patient?

## 2015-06-18 NOTE — Telephone Encounter (Signed)
Spoke with pt. States that he would like BQ to call him on his work number >> [phone]. He has a meeting this AM from 10-11:30am. So either call before 10am or after 11:30am.

## 2015-06-18 NOTE — Telephone Encounter (Signed)
I called him today to discuss the results from his bronchoscopy showing granulomatous inflammation on lung biopsy and mediastinal lymph node biopsy. 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 He voiced understanding.

## 2015-06-18 NOTE — Telephone Encounter (Signed)
Yes, I asked him to tell me when and where to call him to discuss his results.  I had to leave a message yesterday.

## 2015-07-10 LAB — FUNGUS CULTURE W SMEAR: Fungal Smear: NONE SEEN

## 2015-07-16 ENCOUNTER — Telehealth: Payer: Self-pay | Admitting: Pulmonary Disease

## 2015-07-16 NOTE — Telephone Encounter (Signed)
Called and spoke to pt. Pt is questioning if the cultures from bronch have been resulted yet. Pt stated he is feeling better and his cough has improved.   Dr. Kendrick FriesMcQuaid please advise. Thanks.

## 2015-07-17 NOTE — Telephone Encounter (Signed)
I spoke with patient about results and he verbalized understanding and had no questions 

## 2015-07-17 NOTE — Telephone Encounter (Signed)
BQ - please advise. Thanks. 

## 2015-07-17 NOTE — Telephone Encounter (Signed)
The final culture is still pending, hopefully we will get the final report this week.  Once it is finalized I will call in an Rx for prednisone.

## 2015-07-23 ENCOUNTER — Telehealth: Payer: Self-pay | Admitting: Pulmonary Disease

## 2015-07-23 NOTE — Telephone Encounter (Signed)
Called and spoke with pt Pt asked if final results were back on cultures  Informed pt that i did not see final results from BQ yet Pt asked for a message to be sent to BQ stating he was inquiring about results  Dr Kendrick FriesMcQuaid, please advise. Thanks

## 2015-07-25 NOTE — Telephone Encounter (Signed)
BQ - please advise. Thanks. 

## 2015-07-25 NOTE — Telephone Encounter (Signed)
Called made pt aware. Nothing further needed 

## 2015-07-25 NOTE — Telephone Encounter (Signed)
Still documented as pending

## 2015-07-25 NOTE — Telephone Encounter (Signed)
lmomtcb x1 

## 2015-07-26 ENCOUNTER — Telehealth: Payer: Self-pay

## 2015-07-26 LAB — AFB CULTURE WITH SMEAR (NOT AT ARMC): ACID FAST SMEAR: NONE SEEN

## 2015-07-26 NOTE — Telephone Encounter (Signed)
lmtcb X1 for pt to relay recs/verify pharmacy.  Wcb.

## 2015-07-26 NOTE — Telephone Encounter (Signed)
-----   Message from Lupita Leashouglas B McQuaid, MD sent at 07/26/2015  5:05 PM EST ----- perfect ----- Message -----    From: Velvet BatheAshley L Dorrene Bently, CMA    Sent: 07/26/2015   5:04 PM      To: Lupita Leashouglas B McQuaid, MD  Pt has appt scheduled 12/15 with you.  Does this need to be moved out?  If so, ok to doublebook?  ----- Message -----    From: Lupita Leashouglas B McQuaid, MD    Sent: 07/26/2015   5:00 PM      To: Velvet BatheAshley L Deneshia Zucker, CMA  A, Great news His culture finally came back: negative Please call in prednisone 30mg  daily, take until he sees me next which needs to be within 4-6 weeks B

## 2015-07-29 MED ORDER — PREDNISONE 10 MG PO TABS: 30.0000 mg | ORAL_TABLET | Freq: Every day | ORAL | Status: DC

## 2015-07-29 NOTE — Telephone Encounter (Signed)
Patient Returned call [phone]

## 2015-07-29 NOTE — Telephone Encounter (Signed)
Pt is aware of results. Rx has been sent in. Nothing further was needed.

## 2015-08-01 ENCOUNTER — Encounter: Payer: Self-pay | Admitting: Pulmonary Disease

## 2015-08-01 ENCOUNTER — Ambulatory Visit (INDEPENDENT_AMBULATORY_CARE_PROVIDER_SITE_OTHER): Payer: PRIVATE HEALTH INSURANCE | Admitting: Pulmonary Disease

## 2015-08-01 VITALS — BP 138/80 | HR 79 | Ht 68.0 in | Wt 193.0 lb

## 2015-08-01 DIAGNOSIS — D869 Sarcoidosis, unspecified: Secondary | ICD-10-CM

## 2015-08-01 DIAGNOSIS — Z23 Encounter for immunization: Secondary | ICD-10-CM

## 2015-08-01 MED ORDER — TRAZODONE HCL 50 MG PO TABS: 50.0000 mg | ORAL_TABLET | Freq: Every day | ORAL | Status: DC

## 2015-08-01 NOTE — Assessment & Plan Note (Signed)
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 This is in keeping with the findings from his chest CT, his arthritis, and his rash. His culture did not show evidence of any sort of infection. 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 We explained the complexity of this disease and the fact that it can involve any tissue in the body. At this point he does seem to be improving. However, he continues to have significant fatigue and some cough and so after lengthy discussion of the risks and benefits of prednisone and the fact the prednisone does not change the long-term outcome of sarcoid he has decided to take a brief course. We decided to shorten his course to just 6 weeks of prednisone at this time.  What is not clear to me is if the Humira may have been related to this diagnosis. In case reports some have speculated that Humira may be related but there is no clear causative effect that I'm aware of.  Plan: Prednisone short course 30 mg daily for 2 weeks followed by 20 mg daily for 2 weeks followed by 10 mg daily for 2 weeks then stop He requests a sleep aid for use with prednisone so I will prescribe trazodone 50 mg daily at bedtime EKG today Establish care with an ophthalmologist Discussed the diagnosis with your rheumatologist, I would be happy to talk about this with Dr. Gavin PottersKernodle. Follow-up with me in 3 months  Greater than 50% time was spent in direct consultation with the patient and his wife answering the questions as part of a 25 minute visit.

## 2015-08-01 NOTE — Progress Notes (Signed)
Subjective:    Patient ID: Jimmy Macdonald, male    DOB: 1955/11/21, 59 y.o.   MRN: [id number]  Synopsis: Referred to [facility] in 2016 for evaluation of cough, shortness of breath. Has psoriatic arthritis and this has been treated with Methotrexate initially around 2005 but then eventually Humira. 05/2015 CT chest > scattered ground glass, more predominant in the upper lobes, prominent mediastinal and hilar lymphadenopathy 05/2015 EBUS FNA subcarinal lymph node, transbronchial biopsy, > both with granulomatous inflammation; BAL culture negative for bacteria, fungus, and AFB   HPI Chief Complaint  Patient presents with  . Follow-up    review bronch, labs.  pt states cough has improved but does note persistent rash on b/l lower legs and dry mouth.     Jimmy Macdonald says taht he feels better.  The cough has decreased.  He is no longer coughing up a lot of phlegm.  He continues to have a tender rash on his legs from time ot time.   He still has fatigue, but no dyspnea.  He is sleeping well and feels like it is important to get more sleep to make it throughout the day.  In general he is better than before.  Past Medical History  Diagnosis Date  . Hypertension   . Environmental allergies   . Psoriatic arthritis (HCC)   . Hx of blood clots     RT LEG  10 YRS AGO  . Cough   . Thyroid nodule     BENIGN   . Headache     SINUS  . 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 This is in keeping with the findings from his chest CT, his arthritis, and his rash. His culture did not show evidence of any sort of infection. 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 We explained the complexity of this disease and the fact that it can involve any tissue in the body. At this point he does seem to be improving. However, he continues to have significant fatigue and some cough and so after lengthy discussion of the risks and benefits of prednisone and the fact the prednisone does not change the long-term outcome of sarcoid he has decided to take a brief course. We decided to shorten his course to just 6 weeks of prednisone at this time.  What is not clear to me is if the Humira may have been related to this diagnosis. In case reports some have speculated that Humira may be related but there is no clear causative effect that I'm aware of.  Plan: Prednisone short course 30 mg daily for 2 weeks followed by 20 mg daily for 2 weeks followed by 10 mg daily for 2 weeks then stop He requests a sleep aid for use with prednisone so I will prescribe trazodone 50 mg daily at bedtime EKG today  Establish care with an ophthalmologist Discussed the diagnosis with your rheumatologist, I would be happy to talk about this with Dr. Gavin Potters. Follow-up with me in 3 months  Greater than 50% time was spent in direct consultation with the patient and his wife answering the questions as part of a 25 minute visit.     Current outpatient prescriptions:  .  aspirin 81 MG tablet, Take 81 mg by mouth daily., Disp: , Rfl:  .  chlorpheniramine-HYDROcodone (TUSSIONEX PENNKINETIC ER) 10-8 MG/5ML SUER, Take 5 mLs by mouth at bedtime as needed for cough., Disp: , Rfl:  .  lisinopril-hydrochlorothiazide (PRINZIDE,ZESTORETIC) 20-12.5 MG tablet, Take 1 tablet by mouth daily., Disp: , Rfl:  .  simvastatin (ZOCOR) 20 MG tablet, Take 1 tablet by mouth at bedtime., Disp: , Rfl:  .  predniSONE  (DELTASONE) 10 MG tablet, Take 3 tablets (30 mg total) by mouth daily with breakfast. (Patient not taking: Reported on 08/01/2015), Disp: 30 tablet, Rfl: 0 .  traZODone (DESYREL) 50 MG tablet, Take 1 tablet (50 mg total) by mouth at bedtime., Disp: 30 tablet, Rfl: 0

## 2015-08-01 NOTE — Patient Instructions (Signed)
Start taking prednisone 30 mg daily, take that for 2 weeks, then take 20 mg daily for 2 weeks, then takes 10 mg daily for 2 weeks then stop [facility] Establish care with an ophthalmologist Go back and talk to Dr. Gavin PottersKernodle about your diagnosis I will see you back in 3 months or sooner if needed

## 2015-08-09 ENCOUNTER — Telehealth: Payer: Self-pay | Admitting: Pulmonary Disease

## 2015-08-09 MED ORDER — PREDNISONE 10 MG PO TABS: ORAL_TABLET | ORAL | Status: DC

## 2015-08-09 NOTE — Telephone Encounter (Signed)
Spoke with pt's wife. States that when the pt was here last BQ changed his prednisone dosing around. This prescription was not sent in. This has been taken care per BQ's AVS instructions. Nothing further was needed.

## 2015-11-14 ENCOUNTER — Ambulatory Visit (INDEPENDENT_AMBULATORY_CARE_PROVIDER_SITE_OTHER): Payer: PRIVATE HEALTH INSURANCE | Admitting: Pulmonary Disease

## 2015-11-14 ENCOUNTER — Encounter: Payer: Self-pay | Admitting: Pulmonary Disease

## 2015-11-14 VITALS — BP 136/78 | HR 77 | Ht 68.0 in | Wt 201.0 lb

## 2015-11-14 DIAGNOSIS — R0789 Other chest pain: Secondary | ICD-10-CM | POA: Diagnosis not present

## 2015-11-14 DIAGNOSIS — D869 Sarcoidosis, unspecified: Secondary | ICD-10-CM

## 2015-11-14 LAB — PULMONARY FUNCTION TEST
DL/VA % PRED: 100 %
DL/VA: 4.52 ml/min/mmHg/L
DLCO COR % PRED: 81 %
DLCO COR: 24.14 ml/min/mmHg
DLCO unc % pred: 83 %
DLCO unc: 24.86 ml/min/mmHg
FEF 25-75 POST: 2.42 L/s
FEF 25-75 Pre: 2.39 L/sec
FEF2575-%Change-Post: 1 %
FEF2575-%Pred-Post: 89 %
FEF2575-%Pred-Pre: 88 %
FEV1-%CHANGE-POST: 0 %
FEV1-%Pred-Post: 92 %
FEV1-%Pred-Pre: 93 %
FEV1-Post: 3.05 L
FEV1-Pre: 3.07 L
FEV1FVC-%Change-Post: 2 %
FEV1FVC-%PRED-PRE: 102 %
FEV6-%Change-Post: -2 %
FEV6-%Pred-Post: 92 %
FEV6-%Pred-Pre: 95 %
FEV6-POST: 3.88 L
FEV6-Pre: 3.98 L
FEV6FVC-%Change-Post: 0 %
FEV6FVC-%PRED-POST: 104 %
FEV6FVC-%PRED-PRE: 104 %
FVC-%CHANGE-POST: -2 %
FVC-%PRED-POST: 88 %
FVC-%PRED-PRE: 91 %
FVC-POST: 3.9 L
FVC-PRE: 4.01 L
PRE FEV6/FVC RATIO: 99 %
Post FEV1/FVC ratio: 78 %
Post FEV6/FVC ratio: 100 %
Pre FEV1/FVC ratio: 77 %
RV % pred: -35 %
RV: -0.77 L
TLC % PRED: 79 %
TLC: 5.25 L

## 2015-11-14 NOTE — Progress Notes (Signed)
PFT done today. 

## 2015-11-14 NOTE — Assessment & Plan Note (Signed)
He has some mild intermittent chest tightness.  In 2016 his EKG was normal and he tells me that he has had a negative cardiac workup in the past, but he can't remember when.  Plan: He will discuss this with his primary care physician in 2 weeks when he goes to see him, he may need to have a repeat stress test.

## 2015-11-14 NOTE — Assessment & Plan Note (Signed)
He had evidence of sarcoidosis on his transbronchial and subcarinal lymph node biopsies in October 2016. Fortunately, he does not have significant evidence of ongoing active pulmonary disease is his lung function testing is normal and he has no symptoms of cough or shortness of breath.  Plan: Continue annual monitoring here with ulnar function testing Continue follow-up with his rheumatologist I would be okay with you taking methotrexate if needed for his joint disease which main fact be related to his sarcoid

## 2015-11-14 NOTE — Patient Instructions (Signed)
Talk to year physician about the last time he had a stress test, he may want to repeat it if it's been more than 5 years because of the chest tightness you have been experiencing We will see you back in one year with a pulmonary function test

## 2015-11-14 NOTE — Progress Notes (Signed)
Subjective:    Patient ID: Jimmy Macdonald, male    DOB: [birth year], 60 y.o.   MRN: [id number]  Synopsis: Referred to [facility] in 2016 for evaluation of cough, shortness of breath. Has psoriatic arthritis and this has been treated with Methotrexate initially around 2005 but then eventually Humira. 05/2015 CT chest > scattered ground glass, more predominant in the upper lobes, prominent mediastinal and hilar lymphadenopathy 05/2015 EBUS FNA subcarinal lymph node, transbronchial biopsy, > both with granulomatous inflammation; BAL culture negative for bacteria, fungus, and AFB 07/2015 EKG> normal without interval lengthening  March 2017 pulmonary function testing ratio 78%, FVC 3.90 L (88% predicted), total lung capacity 5.25 L (79% predicted), DLCO 24.86 (83% predicted).   HPI Chief Complaint  Patient presents with  . Follow-up    review PFT.  pt states he is doing well; cough has resolved.  pt notes chronic seasonal allergies problems-not yet bothering him.     Jimmy Macdonald has been doing pretty well.  He says that there are times that he is fatigued at night.  He drives a long ways to work every day and works a long day so he thinks the fatigue is expected.  He can climb steps and stairs without difficulty.  H ehas a lot of joint problems.  He has no dyspnea, no cough.  He still has a rash on the inside part of legs and he has a lot of joints aches which was.  He has remained off of the Humira since his episode last year.  He can chop wood and work outside without too much difficulty for 6 hours steady.   He will occasionally have chest tightness when he works a lot or exercises a lot.  He describes soreness, it is non-radiating, will last for a few hours at a time.  He can't say that it is necessarily worse with particular movement.  He had a brother with early CAD.  He had his eyes checked by the opthalmologist.   Past Medical History  Diagnosis Date  . Hypertension   . Environmental  allergies   . Psoriatic arthritis (HCC)   . Hx of blood clots     RT LEG  10 YRS AGO  . Cough   . Thyroid nodule     BENIGN   . Headache     SINUS  . Arthritis       Review of Systems     Objective:   Physical Exam  Filed Vitals:   11/14/15 0954  BP: 136/78  Pulse: 77  Height: 5\' 8"  (1.727 m)  Weight: 201 lb (91.173 kg)  SpO2: 98%  RA  Gen: well appearing HENT: OP clear, TM's clear, neck supple PULM: CTA B, normal percussion CV: RRR, no mgr, trace edema GI: BS+, soft, nontender Derm: no cyanosis or rash Psyche: normal mood and affect        Assessment & Plan:  Sarcoidosis (HCC) He had evidence of sarcoidosis on his transbronchial and subcarinal lymph node biopsies in October 2016. Fortunately, he does not have significant evidence of ongoing active pulmonary disease is his lung function testing is normal and he has no symptoms of cough or shortness of breath.  Plan: Continue annual monitoring here with ulnar function testing Continue follow-up with his rheumatologist I would be okay with you taking methotrexate if needed for his joint disease which main fact be related to his sarcoid  Chest tightness He has some mild intermittent chest tightness.  In 2016  his EKG was normal and he tells me that he has had a negative cardiac workup in the past, but he can't remember when.  Plan: He will discuss this with his primary care physician in 2 weeks when he goes to see him, he may need to have a repeat stress test.     Current outpatient prescriptions:  .  aspirin 81 MG tablet, Take 81 mg by mouth daily., Disp: , Rfl:  .  lisinopril-hydrochlorothiazide (PRINZIDE,ZESTORETIC) 20-12.5 MG tablet, Take 1 tablet by mouth daily., Disp: , Rfl:  .  Multiple Vitamin (MULTIVITAMIN WITH MINERALS) TABS tablet, Take 1 tablet by mouth daily., Disp: , Rfl:  .  simvastatin (ZOCOR) 20 MG tablet, Take 1 tablet by mouth at bedtime. Reported on 11/14/2015, Disp: , Rfl:  .  traMADol  (ULTRAM) 50 MG tablet, Take 50 mg by mouth every 6 (six) hours as needed., Disp: , Rfl:

## 2016-01-07 ENCOUNTER — Ambulatory Visit
Admission: RE | Admit: 2016-01-07 | Discharge: 2016-01-07 | Disposition: A | Discharge status: Home / Self Care | Payer: PRIVATE HEALTH INSURANCE | Source: Ambulatory Visit | Attending: Internal Medicine | Admitting: Internal Medicine

## 2016-01-07 ENCOUNTER — Other Ambulatory Visit: Payer: Self-pay | Admitting: Internal Medicine

## 2016-01-07 DIAGNOSIS — N139 Obstructive and reflux uropathy, unspecified: Secondary | ICD-10-CM

## 2016-10-21 IMAGING — CR DG CHEST 1V PORT
1 series · 1 of 1 positions shown · non-contrast
Comparison: Chest CTA 05/22/2015

CLINICAL DATA: Status post bronchoscopy with biopsy.

EXAM:
PORTABLE CHEST 1 VIEW

[AP]
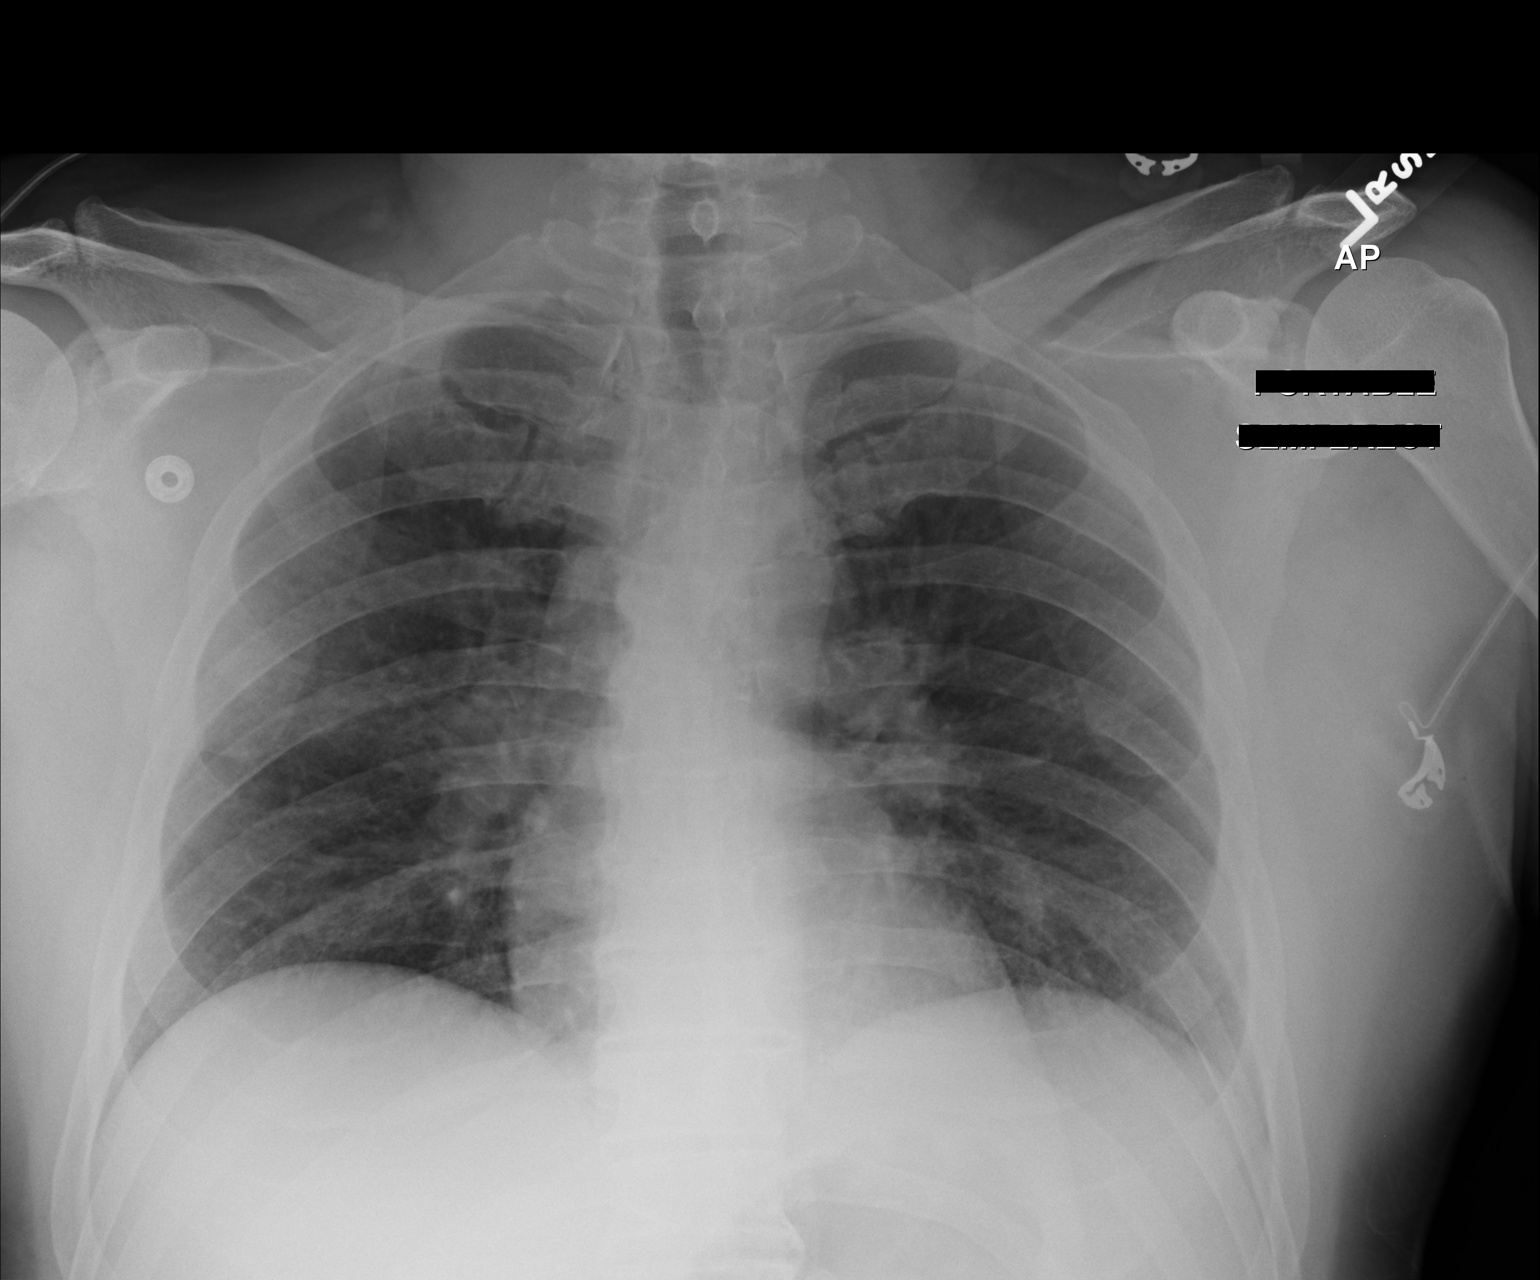

[1 of 1 positions shown; findings below may reference images not displayed]

FINDINGS: Cardiac silhouette is within normal limits for size. Mild bilateral
hilar fullness corresponds to lymph node enlargement on prior chest
CT. The lungs are mildly hypoinflated. There is hazy opacity in the
right upper lobe. No pleural effusion or pneumothorax is identified.
No acute osseous abnormality is seen.
IMPRESSION: 1. Hazy right upper lobe opacity which may reflect a combination of
post bronchoscopy changes as well as underlying ground-glass nodules
on prior chest CT.
2. No pneumothorax.
3. Bilateral hilar enlargement consistent with known underlying
lymphadenopathy.

## 2016-11-20 ENCOUNTER — Other Ambulatory Visit: Payer: Self-pay | Admitting: Pulmonary Disease

## 2016-11-20 DIAGNOSIS — R05 Cough: Secondary | ICD-10-CM

## 2016-11-20 DIAGNOSIS — R059 Cough, unspecified: Secondary | ICD-10-CM

## 2016-11-20 DIAGNOSIS — D869 Sarcoidosis, unspecified: Secondary | ICD-10-CM

## 2016-11-23 ENCOUNTER — Encounter: Payer: Self-pay | Admitting: Pulmonary Disease

## 2016-11-23 ENCOUNTER — Ambulatory Visit (INDEPENDENT_AMBULATORY_CARE_PROVIDER_SITE_OTHER): Payer: PRIVATE HEALTH INSURANCE | Admitting: Pulmonary Disease

## 2016-11-23 VITALS — BP 126/62 | HR 78 | Ht 68.0 in | Wt 204.0 lb

## 2016-11-23 DIAGNOSIS — D869 Sarcoidosis, unspecified: Secondary | ICD-10-CM | POA: Diagnosis not present

## 2016-11-23 DIAGNOSIS — R05 Cough: Secondary | ICD-10-CM | POA: Diagnosis not present

## 2016-11-23 DIAGNOSIS — R059 Cough, unspecified: Secondary | ICD-10-CM

## 2016-11-23 LAB — PULMONARY FUNCTION TEST
DL/VA % PRED: 100 %
DL/VA: 4.5 ml/min/mmHg/L
DLCO COR % PRED: 79 %
DLCO COR: 23.6 ml/min/mmHg
DLCO UNC % PRED: 83 %
DLCO unc: 24.6 ml/min/mmHg
FEF 25-75 PRE: 2.08 L/s
FEF 25-75 Post: 2.61 L/sec
FEF2575-%CHANGE-POST: 25 %
FEF2575-%PRED-POST: 98 %
FEF2575-%PRED-PRE: 78 %
FEV1-%Change-Post: 5 %
FEV1-%Pred-Post: 89 %
FEV1-%Pred-Pre: 84 %
FEV1-Post: 2.91 L
FEV1-Pre: 2.75 L
FEV1FVC-%CHANGE-POST: 2 %
FEV1FVC-%Pred-Pre: 100 %
FEV6-%CHANGE-POST: 3 %
FEV6-%Pred-Post: 90 %
FEV6-%Pred-Pre: 87 %
FEV6-Post: 3.73 L
FEV6-Pre: 3.62 L
FEV6FVC-%Change-Post: 0 %
FEV6FVC-%PRED-PRE: 104 %
FEV6FVC-%Pred-Post: 104 %
FVC-%Change-Post: 2 %
FVC-%Pred-Post: 86 %
FVC-%Pred-Pre: 83 %
FVC-PRE: 3.64 L
FVC-Post: 3.75 L
POST FEV1/FVC RATIO: 78 %
PRE FEV1/FVC RATIO: 76 %
Post FEV6/FVC ratio: 100 %
Pre FEV6/FVC Ratio: 100 %
RV % pred: 88 %
RV: 1.94 L
TLC % pred: 86 %
TLC: 5.73 L

## 2016-11-23 NOTE — Patient Instructions (Signed)
I would like for you to exercise on a regular basis We'll arrange for a one-year follow-up visit with a lung function test

## 2016-11-23 NOTE — Progress Notes (Signed)
Subjective:    Patient ID: Jimmy Macdonald, male    DOB: Aug 24, 1955, 61 y.o.   MRN: [id number]  Synopsis: Referred to [facility] in 2016 for evaluation of cough, shortness of breath. Has psoriatic arthritis and this has been treated with Methotrexate initially around 2005 but then eventually Humira.    HPI Chief Complaint  Patient presents with  . Follow-up     1 yr rov with PFT.  pt states he is doing well overall, notes seasonal allergy symptoms, sob with exertion.     Jimmy Macdonald has been doing well.  He says that this time of year he has a lot of sinus symptoms which bothers him a lot.  He says that he remains active, he has to pace himself due to some mild dyspnea.  He says that he feels his age, but overall he is doing OK.  He has a rare cough only with a lot of sinus congestion.  He had a sinus infection a few weeks ago and had to take an antibiotics.   He doesn't really feel like his breahting has worsened, but he feels like his energy level isan't quite where it was a year ago.  Its hard for him to say if his breathing is worse in the last year, but it is definitely worse than it was several years ago.  He recalls an episode of dyspnea this summer.    Past Medical History:  Diagnosis Date  . Arthritis   . Cough   . Environmental allergies   . Headache    SINUS  . Hx of blood clots    RT LEG  10 YRS AGO  . Hypertension   . Psoriatic arthritis (HCC)   . Thyroid nodule    BENIGN       Review of Systems     Objective:   Physical Exam  Vitals:   11/23/16 1601  BP: 126/62  Pulse: 78  SpO2: 97%  Weight: 204 lb (92.5 kg)  Height:  (1.727 m)  RA  Gen: well appearing HENT: OP clear, TM's clear, neck supple PULM: CTA B, normal percussion CV: RRR, no mgr, trace edema GI: BS+, soft, nontender Derm: no cyanosis or rash Psyche: normal mood and affect   Imaging: 05/2015 CT chest > scattered ground glass, more predominant in the upper lobes, prominent  mediastinal and hilar lymphadenopathy  Path: 05/2015 EBUS FNA subcarinal lymph node, transbronchial biopsy, > both with granulomatous inflammation; BAL culture negative for bacteria, fungus, and AFB  Other 07/2015 EKG> normal without interval lengthening  PFT:  March 2017 pulmonary function testing ratio 78%, FVC 3.90 L (88% predicted), total lung capacity 5.25 L (79% predicted), DLCO 24.86 (83% predicted). April 2018 function testing ratio 78%, FEV1 2.91 L 89% predicted, FVC 3.75 L 86% predicted, total lung capacity 5.73 L 86% predicted, DLCO 24.60 mL per minute per millimeters of mercury 83%     Assessment & Plan:  Sarcoidosis (HCC) His lung function testing today shows no evidence of worsening compared to one year ago. His symptoms are stable. So given his limited stage disease I see no evidence of progression of his pulmonary sarcoidosis. Most patients in this situation do not have progression.  Plan: I encouraged him to exercise regularly Follow-up in one year with a lung function test, at that point will be 3 years out from the time of his diagnosis and we could probably do as needed visits after that    Current Outpatient Prescriptions:  .  aspirin 81 MG tablet, Take 81 mg by mouth daily., Disp: , Rfl:  .  doxazosin (CARDURA) 2 MG tablet, Take 2 mg by mouth daily., Disp: , Rfl:  .  lisinopril-hydrochlorothiazide (PRINZIDE,ZESTORETIC) 20-12.5 MG tablet, Take 1 tablet by mouth daily., Disp: , Rfl:  .  Multiple Vitamin (MULTIVITAMIN WITH MINERALS) TABS tablet, Take 1 tablet by mouth daily., Disp: , Rfl:  .  simvastatin (ZOCOR) 20 MG tablet, Take 1 tablet by mouth at bedtime. Reported on 11/14/2015, Disp: , Rfl:  .  tamsulosin (FLOMAX) 0.4 MG CAPS capsule, Take 0.4 mg by mouth daily after breakfast., Disp: , Rfl:  .  traMADol (ULTRAM) 50 MG tablet, Take 50 mg by mouth every 6 (six) hours as needed., Disp: , Rfl:

## 2016-11-23 NOTE — Assessment & Plan Note (Signed)
His lung function testing today shows no evidence of worsening compared to one year ago. His symptoms are stable. So given his limited stage disease I see no evidence of progression of his pulmonary sarcoidosis. Most patients in this situation do not have progression.  Plan: I encouraged him to exercise regularly Follow-up in one year with a lung function test, at that point will be 3 years out from the time of his diagnosis and we could probably do as needed visits after that

## 2016-11-23 NOTE — Progress Notes (Signed)
PFT done today. 

## 2017-02-22 ENCOUNTER — Ambulatory Visit
Admission: RE | Admit: 2017-02-22 | Discharge: 2017-02-22 | Disposition: A | Discharge status: Home / Self Care | Payer: PRIVATE HEALTH INSURANCE | Source: Ambulatory Visit | Attending: Internal Medicine | Admitting: Internal Medicine

## 2017-02-22 ENCOUNTER — Other Ambulatory Visit: Payer: Self-pay | Admitting: Internal Medicine

## 2017-02-22 DIAGNOSIS — G9340 Encephalopathy, unspecified: Secondary | ICD-10-CM | POA: Insufficient documentation

## 2017-11-17 ENCOUNTER — Encounter: Payer: Self-pay | Admitting: Pulmonary Disease

## 2017-12-29 ENCOUNTER — Ambulatory Visit: Payer: PRIVATE HEALTH INSURANCE | Admitting: Pulmonary Disease

## 2018-01-03 ENCOUNTER — Telehealth: Payer: Self-pay | Admitting: Pulmonary Disease

## 2018-01-03 NOTE — Telephone Encounter (Signed)
Doesn't he live in [place]? He could have the PFT at [facility]

## 2018-01-03 NOTE — Telephone Encounter (Signed)
Called and spoke to pt's spouse, Jimmy Macdonald. DonDondra Spryra Macdonald was calling for update for re scheduling OV and PFT. It appears that 12/29/17 OV was canceled, due to pt requesting to have PFT and OV on same day. It does not appear that pt was scheduled for PFT, as instructed by BQ during 11/23/16 OV. First available PFT and OV with BQ is 02/22/18, I have offered sooner apt with NP and Jimmy Macdonald declined.    Nothing further is needed.  Will route to BQ as an FYI.  Editor: Lupita Leash, MD (Physician)     I would like for you to exercise on a regular basis We'll arrange for a one-year follow-up visit with a lung function test

## 2018-01-04 NOTE — Telephone Encounter (Signed)
Attempted to call pt. I did not receive an answer. I have left a message for pt to return our call.  

## 2018-01-04 NOTE — Telephone Encounter (Addendum)
He could get it done in [place] but you still do not have an opening until July. If 02/22/18 too far out in your opinion? He has PFT and OV with you scheduled, that was the earliest date we could do both. Please advise.

## 2018-01-04 NOTE — Telephone Encounter (Signed)
If he is feeling well then he can wait until July and have both his PFT and visit with me on same day

## 2018-01-05 NOTE — Telephone Encounter (Signed)
Attempted to call pt. I did not receive an answer. I have left a message for pt to return our call.  

## 2018-01-05 NOTE — Telephone Encounter (Signed)
Pt wife Dondra Spry is calling back [phone]

## 2018-01-05 NOTE — Telephone Encounter (Signed)
Jimmy Macdonald is aware of below message and voiced her understanding.  Apt has been scheduled with OV & PFT on 02/22/18. Nothing further is needed.

## 2018-02-22 ENCOUNTER — Ambulatory Visit (INDEPENDENT_AMBULATORY_CARE_PROVIDER_SITE_OTHER): Payer: PRIVATE HEALTH INSURANCE | Admitting: Pulmonary Disease

## 2018-02-22 ENCOUNTER — Other Ambulatory Visit: Payer: Self-pay | Admitting: Pulmonary Disease

## 2018-02-22 ENCOUNTER — Ambulatory Visit (INDEPENDENT_AMBULATORY_CARE_PROVIDER_SITE_OTHER)
Admission: RE | Admit: 2018-02-22 | Discharge: 2018-02-22 | Disposition: A | Discharge status: Home / Self Care | Payer: PRIVATE HEALTH INSURANCE | Source: Ambulatory Visit | Attending: Pulmonary Disease | Admitting: Pulmonary Disease

## 2018-02-22 ENCOUNTER — Encounter: Payer: Self-pay | Admitting: Pulmonary Disease

## 2018-02-22 VITALS — BP 104/64 | HR 84 | Ht 68.0 in | Wt 212.0 lb

## 2018-02-22 DIAGNOSIS — R059 Cough, unspecified: Secondary | ICD-10-CM

## 2018-02-22 DIAGNOSIS — D869 Sarcoidosis, unspecified: Secondary | ICD-10-CM | POA: Diagnosis not present

## 2018-02-22 DIAGNOSIS — R05 Cough: Secondary | ICD-10-CM | POA: Diagnosis not present

## 2018-02-22 LAB — PULMONARY FUNCTION TEST
DL/VA % PRED: 104 %
DL/VA: 4.71 ml/min/mmHg/L
DLCO UNC % PRED: 79 %
DLCO unc: 23.54 ml/min/mmHg
FEF 25-75 POST: 2.35 L/s
FEF 25-75 PRE: 2.18 L/s
FEF2575-%CHANGE-POST: 7 %
FEF2575-%PRED-POST: 90 %
FEF2575-%PRED-PRE: 83 %
FEV1-%Change-Post: 1 %
FEV1-%Pred-Post: 85 %
FEV1-%Pred-Pre: 84 %
FEV1-PRE: 2.71 L
FEV1-Post: 2.75 L
FEV1FVC-%CHANGE-POST: 3 %
FEV1FVC-%Pred-Pre: 102 %
FEV6-%CHANGE-POST: -2 %
FEV6-%PRED-PRE: 85 %
FEV6-%Pred-Post: 83 %
FEV6-Post: 3.43 L
FEV6-Pre: 3.51 L
FEV6FVC-%Change-Post: 0 %
FEV6FVC-%Pred-Post: 105 %
FEV6FVC-%Pred-Pre: 104 %
FVC-%CHANGE-POST: -2 %
FVC-%PRED-POST: 79 %
FVC-%PRED-PRE: 81 %
FVC-POST: 3.44 L
FVC-PRE: 3.53 L
POST FEV1/FVC RATIO: 80 %
POST FEV6/FVC RATIO: 100 %
PRE FEV1/FVC RATIO: 77 %
Pre FEV6/FVC Ratio: 99 %
RV % pred: 88 %
RV: 1.95 L
TLC % pred: 82 %
TLC: 5.43 L

## 2018-02-22 NOTE — Progress Notes (Signed)
Subjective:    Patient ID: Jimmy Macdonald, male    DOB: 1955-10-30, 62 y.o.   MRN: [id number]  Synopsis: Pulmonary sarcoidosis.  Referred to [facility] in 2016 for evaluation of cough, shortness of breath. Has psoriatic arthritis and this has been treated with Methotrexate initially around 2005 but then eventually Humira.  Fine needle aspiration and transbronchial biopsies showed granulomatous inflammation in 05/2015    HPI Chief Complaint  Patient presents with  . Follow-up    PFT done today   Jimmy Macdonald says that this has been a stable year for him.  He has not had any problems with bronchitis or pneumonia.  He did have a cough a few months back after he was exposed to some dust in his home but he says this only lasted for a little while.  He also has some allergic rhinitis symptoms which make him cough from time to time.  However, he describes no functional limitation related to his breathing.  Specifically he says that he is able to climb 12 flights of stairs continuously (he has had to do this quite a bit over the last several months as he is working on a building in [place].  He denies any problems with cough or mucus production.  No new eye problems or palpitations.  Past Medical History:  Diagnosis Date  . Arthritis   . Cough   . Environmental allergies   . Headache    SINUS  . Hx of blood clots    RT LEG  10 YRS AGO  . Hypertension   . Psoriatic arthritis (HCC)   . Thyroid nodule    BENIGN       Review of Systems     Objective:   Physical Exam  Vitals:   02/22/18 1633  BP: 104/64  Pulse: 84  SpO2: 97%  Weight: 212 lb (96.2 kg)  Height: 5\' 8"  (1.727 m)  RA  Gen: well appearing HENT: OP clear, TM's clear, neck supple PULM: CTA B, normal percussion CV: RRR, no mgr, trace edema GI: BS+, soft, nontender Derm: no cyanosis or rash Psyche: normal mood and affect    Imaging: 05/2015 CT chest > scattered ground glass, more predominant in the upper  lobes, prominent mediastinal and hilar lymphadenopathy  Path: 05/2015 EBUS FNA subcarinal lymph node, transbronchial biopsy, > both with granulomatous inflammation; BAL culture negative for bacteria, fungus, and AFB  Micro: BAL from October 2016- for bacteria, fungus, AFB organisims  Other 07/2015 EKG> normal without interval lengthening  PFT:  March 2017 pulmonary function testing ratio 78%, FVC 3.90 L (88% predicted), total lung capacity 5.25 L (79% predicted), DLCO 24.86 (83% predicted). April 2018 function testing ratio 78%, FEV1 2.91 L 89% predicted, FVC 3.75 L 86% predicted, total lung capacity 5.73 L 86% predicted, DLCO 24.60 mL per minute per millimeters of mercury 83% July 2019 ratio normal, FVC 3.53L  85% predicted, total lung capacity 5.43 L 82% predicted, DLCO 23.45 79% predicted    Assessment & Plan:   Sarcoidosis  Cough  Discussion: This has been another stable interval for Jimmy Macdonald.  He has not had an exacerbation of lung problems in the last year and his lung function testing is yet again stable.  As we have followed him for 3 years now with no evidence of progression I am going to release him from the clinic.  I have counseled him today on the fact that he has sarcoidosis and will always have this condition but  the likelihood of progression is exceedingly low.  He needs to watch out for travel and his breathing or excessive cough with no under explanation, he also needs to make sure that he tells other physicians in the future that he has this diagnosis.  Plan: We will get one last chest x-ray today because of your diagnosis of sarcoidosis to use for comparison in the event that you have problems in the future. Be sure to let other doctors know that you have sarcoid I agree with your plan to continue getting eye exams, be sure the ophthalmologist knows you have sarcoidosis Follow-up with me if you develop worsening shortness of breath or unexplained  cough  Otherwise, we will see you back on an as-needed basis    Current Outpatient Medications:  .  amLODipine (NORVASC) 10 MG tablet, , Disp: , Rfl: 3 .  aspirin 81 MG tablet, Take 81 mg by mouth daily., Disp: , Rfl:  .  doxazosin (CARDURA) 2 MG tablet, Take 2 mg by mouth daily., Disp: , Rfl:  .  lisinopril-hydrochlorothiazide (PRINZIDE,ZESTORETIC) 20-12.5 MG tablet, Take 1 tablet by mouth daily., Disp: , Rfl:  .  Multiple Vitamin (MULTIVITAMIN WITH MINERALS) TABS tablet, Take 1 tablet by mouth daily., Disp: , Rfl:  .  simvastatin (ZOCOR) 20 MG tablet, Take 1 tablet by mouth at bedtime. Reported on 11/14/2015, Disp: , Rfl:  .  tamsulosin (FLOMAX) 0.4 MG CAPS capsule, Take 0.4 mg by mouth daily after breakfast., Disp: , Rfl:  .  traMADol (ULTRAM) 50 MG tablet, Take 50 mg by mouth every 6 (six) hours as needed., Disp: , Rfl:

## 2018-02-22 NOTE — Progress Notes (Signed)
PFT done today. 

## 2018-02-22 NOTE — Patient Instructions (Signed)
Be sure to let other doctors know that you have sarcoid I agree with your plan to continue getting eye exams, be sure the ophthalmologist knows you have sarcoidosis Follow-up with me if you develop worsening shortness of breath or unexplained cough  Otherwise, we will see you back on an as-needed basis

## 2018-02-24 ENCOUNTER — Telehealth: Payer: Self-pay | Admitting: Pulmonary Disease

## 2018-02-24 NOTE — Telephone Encounter (Signed)
Pt's wife Dondra SpryGail is calling back [phone]

## 2018-02-24 NOTE — Telephone Encounter (Signed)
Per 02/22/18 result note: Result Notes for DG Chest 2 View  Notes recorded by Tana Feltsillman, Bettie J, RN on 02/24/2018 at 9:19 AM EDT Attempted to call patient, no answer, left message to call back. ------  Notes recorded by Tana Feltsillman, Bettie J, RN on 02/23/2018 at 1:37 PM EDT Attempted to call patient, no answer, left message to call back. ------  Notes recorded by Lupita LeashMcQuaid, Douglas B, MD on 02/23/2018 at 1:09 PM EDT BJ, Please let the patient know this was OK Thanks, B    ATC patient, mailbox is full and unable to accept any messages at this time. WCB

## 2018-02-24 NOTE — Progress Notes (Signed)
See 02/24/18 phonenote. 

## 2018-02-24 NOTE — Telephone Encounter (Signed)
Spoke with patient's wife Dondra SpryGail. She is aware of results. Nothing else needed at time of call.

## 2020-08-07 ENCOUNTER — Other Ambulatory Visit (HOSPITAL_COMMUNITY): Payer: Self-pay | Admitting: Internal Medicine

## 2020-08-07 ENCOUNTER — Other Ambulatory Visit: Payer: Self-pay | Admitting: Internal Medicine

## 2020-08-07 ENCOUNTER — Other Ambulatory Visit: Payer: Self-pay

## 2020-08-07 ENCOUNTER — Ambulatory Visit
Admission: RE | Admit: 2020-08-07 | Discharge: 2020-08-07 | Disposition: A | Discharge status: Home / Self Care | Payer: PRIVATE HEALTH INSURANCE | Source: Ambulatory Visit | Attending: Internal Medicine | Admitting: Internal Medicine

## 2020-08-07 DIAGNOSIS — R0609 Other forms of dyspnea: Secondary | ICD-10-CM

## 2020-08-07 DIAGNOSIS — I208 Other forms of angina pectoris: Secondary | ICD-10-CM

## 2020-08-07 DIAGNOSIS — R0600 Dyspnea, unspecified: Secondary | ICD-10-CM | POA: Diagnosis present

## 2020-08-07 LAB — POCT I-STAT CREATININE: Creatinine, Ser: 0.9 mg/dL (ref 0.61–1.24)

## 2020-08-07 MED ORDER — IOHEXOL 350 MG/ML SOLN
75.0000 mL | Freq: Once | INTRAVENOUS | Status: AC | PRN
  Administered 2020-08-07: 75 mL via INTRAVENOUS

## 2022-09-21 ENCOUNTER — Other Ambulatory Visit: Payer: Self-pay | Admitting: Internal Medicine

## 2022-09-21 ENCOUNTER — Ambulatory Visit
Admission: RE | Admit: 2022-09-21 | Discharge: 2022-09-21 | Disposition: A | Discharge status: Home / Self Care | Payer: Medicare Other | Source: Ambulatory Visit | Attending: Internal Medicine | Admitting: Internal Medicine

## 2022-09-21 DIAGNOSIS — R109 Unspecified abdominal pain: Secondary | ICD-10-CM

## 2022-09-21 DIAGNOSIS — N390 Urinary tract infection, site not specified: Secondary | ICD-10-CM | POA: Insufficient documentation

## 2022-09-21 DIAGNOSIS — R10A1 Flank pain, right side: Secondary | ICD-10-CM

## 2022-09-21 DIAGNOSIS — A419 Sepsis, unspecified organism: Secondary | ICD-10-CM

## 2024-04-27 ENCOUNTER — Encounter: Payer: Self-pay | Admitting: Ophthalmology

## 2024-04-27 NOTE — Anesthesia Preprocedure Evaluation (Addendum)
 Anesthesia Evaluation  Patient identified by MRN, date of birth, ID band Patient awake    Reviewed: Allergy & Precautions, H&P , NPO status , Patient's Chart, lab work & pertinent test results  Airway Mallampati: IV  TM Distance: >3 FB Neck ROM: Full   Comment: Had surgery for sleep apnea, nose, uvula removed, tonsillectomy, still snores but denies sleep apnea now. States did NOT have jaw advanced. Thick neck.  Dental no notable dental hx.    Pulmonary neg pulmonary ROS, sleep apnea    Pulmonary exam normal breath sounds clear to auscultation       Cardiovascular hypertension, Normal cardiovascular exam Rhythm:Regular Rate:Normal     Neuro/Psych negative neurological ROS  negative psych ROS   GI/Hepatic negative GI ROS, Neg liver ROS,,,  Endo/Other  diabetes    Renal/GU negative Renal ROS  negative genitourinary   Musculoskeletal negative musculoskeletal ROS (+) Arthritis ,    Abdominal   Peds negative pediatric ROS (+)  Hematology negative hematology ROS (+)   Anesthesia Other Findings Hypertension  Environmental allergies Psoriatic arthritis (HCC)  Hx of blood clots Cough  Thyroid nodule Arthritis  Sleep apnea Diabetes mellitus without complication (HCC) Cancer (HCC) Sarcoidosis  Multiple thyroid nodules Absence of uvula    Reproductive/Obstetrics negative OB ROS                              Anesthesia Physical Anesthesia Plan  ASA: 3  Anesthesia Plan: MAC   Post-op Pain Management:    Induction: Intravenous  PONV Risk Score and Plan:   Airway Management Planned: Natural Airway and Nasal Cannula  Additional Equipment:   Intra-op Plan:   Post-operative Plan:   Informed Consent: I have reviewed the patients History and Physical, chart, labs and discussed the procedure including the risks, benefits and alternatives for the proposed anesthesia with the patient or  authorized representative who has indicated his/her understanding and acceptance.     Dental Advisory Given  Plan Discussed with: Anesthesiologist, CRNA and Surgeon  Anesthesia Plan Comments: (Patient consented for risks of anesthesia including but not limited to:  - adverse reactions to medications - damage to eyes, teeth, lips or other oral mucosa - nerve damage due to positioning  - sore throat or hoarseness - Damage to heart, brain, nerves, lungs, other parts of body or loss of life  Patient voiced understanding and assent.)         Anesthesia Quick Evaluation

## 2024-04-27 NOTE — Discharge Instructions (Signed)
 INSTRUCTIONS FOLLOWING OCULOPLASTIC SURGERY AMY EMERSON GAY, MD  AFTER YOUR EYE SURGERY, THER ARE MANY THINGS WHICH YOU, THE PATIENT, CAN DO TO ASSURE THE BEST POSSIBLE RESULT FROM YOUR OPERATION.  THIS SHEET SHOULD BE REFERRED TO WHENEVER QUESTIONS ARISE.  IF THERE ARE ANY QUESTIONS NOT ANSWERED HERE, DO NOT HESITATE TO CALL OUR OFFICE AT [phone] OR [phone].  THERE IS ALWAYS SOMEONE AVAILABLE TO CALL IF QUESTIONS OR PROBLEMS ARISE.  VISION: Your vision may be blurred and out of focus after surgery until you are able to stop using your ointment, swelling resolves and your eye(s) heal. This may take 1 to 2 weeks at the least.  If your vision becomes gradually more dim or dark, this is not normal and you need to call our office immediately.  EYE CARE: For the first 48 hours after surgery, use ice packs frequently - "20 minutes on, 20 minutes off" - to help reduce swelling and bruising.  Small bags of frozen peas or corn make good ice packs along with cloths soaked in ice water.  If you are wearing a patch or other type of dressing following surgery, keep this on for the amount of time specified by your doctor.  For the first week following surgery, you will need to treat your stitches with great care.  It is OK to shower, but take care to not allow soapy water to run into your eye(s) to help reduce chances of infection.  You may gently clean the eyelashes and around the eye(s) with cotton balls and bottled water, BUT DO NOT RUB THE STITCHES VIGOROUSLY.  Keeping your stitches moist with ointment will help promote healing with minimal scar formation.  ACTIVITY: When you leave the surgery center, you should go home, rest and be inactive.  The eye(s) may feel scratchy and keeping the eyes closed will allow for faster healing.  The first week following surgery, avoid straining (anything making the face turn red) or lifting over 20 pounds.  Additionally, avoid bending which causes your head to go below  your waist.  Using your eyes will NOT harm them, so feel free to read, watch television, use the computer, etc as desired.  Driving depends on each individual, so check with your doctor if you have questions about driving. Do not wear contact lenses for about 2 weeks.  Do not wear eye makeup for 2 weeks.  Avoid swimming, hot tubs, gardening, and dusting for 1 to 2 weeks to reduce the risk of an infection.  MEDICATIONS:  You will be given a prescription for an ointment to use 4 times a day on your stitches.  You can use the ointment in your eyes if they feel scratchy or irritated.  If you eyelid(s) don't close completely when you sleep, put some ointment in your eyes before bedtime.  EMERGENCY: If you experience SEVERE EYE PAIN OR HEADACHE UNRELIEVED BY TYLENOL OR TRAMADOL , NAUSEA OR VOMITING, WORSENING REDNESS, OR WORSENING VISION (ESPECIALLY VISION THAT WAS INITIALLY BETTER) CALL [phone] OR [phone] DURING BUSINESS HOURS OR AFTER HOURS.

## 2024-05-05 ENCOUNTER — Ambulatory Visit
Admission: RE | Admit: 2024-05-05 | Discharge: 2024-05-05 | Disposition: A | Discharge status: Home / Self Care | Attending: Ophthalmology | Admitting: Ophthalmology

## 2024-05-05 ENCOUNTER — Ambulatory Visit: Payer: Self-pay | Admitting: Anesthesiology

## 2024-05-05 ENCOUNTER — Other Ambulatory Visit: Payer: Self-pay

## 2024-05-05 ENCOUNTER — Encounter
Admission: RE | Disposition: A | Discharge status: Home / Self Care | Payer: Self-pay | Source: Home / Self Care | Attending: Ophthalmology

## 2024-05-05 ENCOUNTER — Encounter: Payer: Self-pay | Admitting: Ophthalmology

## 2024-05-05 DIAGNOSIS — I10 Essential (primary) hypertension: Secondary | ICD-10-CM | POA: Insufficient documentation

## 2024-05-05 DIAGNOSIS — E119 Type 2 diabetes mellitus without complications: Secondary | ICD-10-CM | POA: Insufficient documentation

## 2024-05-05 DIAGNOSIS — Z7984 Long term (current) use of oral hypoglycemic drugs: Secondary | ICD-10-CM | POA: Diagnosis not present

## 2024-05-05 DIAGNOSIS — G4730 Sleep apnea, unspecified: Secondary | ICD-10-CM | POA: Diagnosis not present

## 2024-05-05 DIAGNOSIS — H02403 Unspecified ptosis of bilateral eyelids: Secondary | ICD-10-CM | POA: Insufficient documentation

## 2024-05-05 HISTORY — DX: Congenital malformations of palate, not elsewhere classified: Q38.5

## 2024-05-05 HISTORY — DX: Malignant (primary) neoplasm, unspecified: C80.1

## 2024-05-05 HISTORY — DX: Sarcoidosis, unspecified: D86.9

## 2024-05-05 HISTORY — PX: PTOSIS REPAIR: SHX6568

## 2024-05-05 HISTORY — DX: Type 2 diabetes mellitus without complications: E11.9

## 2024-05-05 HISTORY — DX: Sleep apnea, unspecified: G47.30

## 2024-05-05 HISTORY — DX: Nontoxic multinodular goiter: E04.2

## 2024-05-05 LAB — GLUCOSE, CAPILLARY: Glucose-Capillary: 133 mg/dL — ABNORMAL HIGH (ref 70–99)

## 2024-05-05 SURGERY — REPAIR, BLEPHAROPTOSIS
Anesthesia: Monitor Anesthesia Care | Laterality: Bilateral

## 2024-05-05 MED ORDER — LIDOCAINE-EPINEPHRINE 2 %-1:100000 IJ SOLN
INTRAMUSCULAR | Status: DC | PRN
  Administered 2024-05-05: 1 mL via OPHTHALMIC

## 2024-05-05 MED ORDER — ONDANSETRON HCL 4 MG/2ML IJ SOLN
INTRAMUSCULAR | Status: DC | PRN
  Administered 2024-05-05: 4 mg via INTRAVENOUS

## 2024-05-05 MED ORDER — MIDAZOLAM HCL 2 MG/2ML IJ SOLN
INTRAMUSCULAR | Status: AC
  Filled 2024-05-05: qty 2

## 2024-05-05 MED ORDER — ONDANSETRON HCL 4 MG/2ML IJ SOLN
INTRAMUSCULAR | Status: AC
  Filled 2024-05-05: qty 2

## 2024-05-05 MED ORDER — LIDOCAINE HCL (PF) 2 % IJ SOLN
INTRAMUSCULAR | Status: AC
  Filled 2024-05-05: qty 5

## 2024-05-05 MED ORDER — BSS IO SOLN
INTRAOCULAR | Status: DC | PRN
  Administered 2024-05-05: 15 mL via INTRAOCULAR

## 2024-05-05 MED ORDER — LIDOCAINE HCL (CARDIAC) PF 100 MG/5ML IV SOSY
PREFILLED_SYRINGE | INTRAVENOUS | Status: DC | PRN
  Administered 2024-05-05: 70 mg via INTRAVENOUS

## 2024-05-05 MED ORDER — ERYTHROMYCIN 5 MG/GM OP OINT: TOPICAL_OINTMENT | OPHTHALMIC | 2 refills | Status: DC

## 2024-05-05 MED ORDER — TRAMADOL HCL 50 MG PO TABS: ORAL_TABLET | ORAL | 0 refills | Status: DC

## 2024-05-05 MED ORDER — SEVOFLURANE IN SOLN
RESPIRATORY_TRACT | Status: AC
  Filled 2024-05-05: qty 250

## 2024-05-05 MED ORDER — PROPOFOL 10 MG/ML IV BOLUS
INTRAVENOUS | Status: AC
  Filled 2024-05-05: qty 20

## 2024-05-05 MED ORDER — FENTANYL CITRATE (PF) 100 MCG/2ML IJ SOLN
INTRAMUSCULAR | Status: DC | PRN
  Administered 2024-05-05: 50 ug via INTRAVENOUS
  Administered 2024-05-05 (×2): 25 ug via INTRAVENOUS

## 2024-05-05 MED ORDER — TETRACAINE HCL 0.5 % OP SOLN
OPHTHALMIC | Status: DC | PRN
  Administered 2024-05-05: 2 [drp] via OPHTHALMIC

## 2024-05-05 MED ORDER — LACTATED RINGERS IV SOLN
INTRAVENOUS | Status: DC
Start: 2024-05-05 — End: 2024-05-05

## 2024-05-05 MED ORDER — PROPOFOL 500 MG/50ML IV EMUL
INTRAVENOUS | Status: DC | PRN
  Administered 2024-05-05: 100 ug/kg/min via INTRAVENOUS

## 2024-05-05 MED ORDER — FENTANYL CITRATE (PF) 100 MCG/2ML IJ SOLN
INTRAMUSCULAR | Status: AC
  Filled 2024-05-05: qty 2

## 2024-05-05 MED ORDER — ERYTHROMYCIN 5 MG/GM OP OINT
TOPICAL_OINTMENT | OPHTHALMIC | Status: DC | PRN
  Administered 2024-05-05: 1 via OPHTHALMIC

## 2024-05-05 MED ORDER — MIDAZOLAM HCL 2 MG/2ML IJ SOLN
INTRAMUSCULAR | Status: DC | PRN
  Administered 2024-05-05 (×2): 1 mg via INTRAVENOUS

## 2024-05-05 SURGICAL SUPPLY — 19 items
APPLICATOR COT TIP 3IN STRL (MISCELLANEOUS) ×5 IMPLANT
BLADE SURG 15 STRL LF DISP TIS (BLADE) ×3 IMPLANT
CORD BIP STRL DISP 12FT (MISCELLANEOUS) ×1 IMPLANT
GAUZE SPONGE 2X2 STRL 8-PLY (GAUZE/BANDAGES/DRESSINGS) ×10 IMPLANT
GAUZE SPONGE 4X4 12PLY STRL (GAUZE/BANDAGES/DRESSINGS) ×2 IMPLANT
GLOVE SURG LX STRL 7.0 MICRO (GLOVE) ×2 IMPLANT
GOWN STRL REUS W/ TWL LRG LVL3 (GOWN DISPOSABLE) ×1 IMPLANT
MARKER SKIN XFINE TIP W/RULER (MISCELLANEOUS) ×1 IMPLANT
NDL 18GX1X1/2 (RX/OR ONLY) (NEEDLE) ×1 IMPLANT
NDL HYPO 30X.5 LL (NEEDLE) ×2 IMPLANT
NEEDLE 18GX1X1/2 (RX/OR ONLY) (NEEDLE) ×1 IMPLANT
NEEDLE HYPO 30X.5 LL (NEEDLE) ×2 IMPLANT
PACK ENT CUSTOM (PACKS) ×1 IMPLANT
SOLUTION PREP PVP 2OZ (MISCELLANEOUS) ×1 IMPLANT
SUT GUT PLAIN 6-0 1X18 ABS (SUTURE) ×1 IMPLANT
SUT PROLENE 6 0 P 1 18 (SUTURE) IMPLANT
SYR 10ML LL (SYRINGE) ×1 IMPLANT
SYR 3ML LL SCALE MARK (SYRINGE) ×1 IMPLANT
WATER STERILE IRR 250ML POUR (IV SOLUTION) ×1 IMPLANT

## 2024-05-05 NOTE — Interval H&P Note (Signed)
 History and Physical Interval Note:  05/05/2024 7:38 AM  Jimmy Macdonald  has presented today for surgery, with the diagnosis of Blepharoptosis.  The various methods of treatment have been discussed with the patient and family. After consideration of risks, benefits and other options for treatment, the patient has consented to  Procedure(s): REPAIR, BLEPHAROPTOSIS (Bilateral) as a surgical intervention.  The patient's history has been reviewed, patient examined, no change in status, stable for surgery.  I have reviewed the patient's chart and labs.  Questions were answered to the patient's satisfaction.     Ashley, Katilynn Sinkler M

## 2024-05-05 NOTE — Anesthesia Postprocedure Evaluation (Signed)
 Anesthesia Post Note  Patient: Jimmy Macdonald  Procedure(s) Performed: REPAIR, BLEPHAROPTOSIS (Bilateral)  Patient location during evaluation: PACU Anesthesia Type: MAC Level of consciousness: awake and alert Pain management: pain level controlled Vital Signs Assessment: post-procedure vital signs reviewed and stable Respiratory status: spontaneous breathing, nonlabored ventilation, respiratory function stable and patient connected to nasal cannula oxygen Cardiovascular status: stable and blood pressure returned to baseline Postop Assessment: no apparent nausea or vomiting Anesthetic complications: no   No notable events documented.   Last Vitals:  Vitals:   05/05/24 0830 05/05/24 0845  BP: (!) 98/56 132/70  Pulse: 64 (!) 59  Resp: 11 12  Temp: 36.6 C 36.6 C  SpO2: 95% 95%    Last Pain:  Vitals:   05/05/24 0845  TempSrc:   PainSc: 0-No pain                 Jameil Whitmoyer C Cordell Guercio

## 2024-05-05 NOTE — Op Note (Signed)
 Preoperative Diagnosis:  Visually significant blepharoptosis bilateral  Upper Eyelid(s)  Postoperative Diagnosis:  Same.  Procedure(s) Performed:   Blepharoptosis repair with levator aponeurosis advancement bilateral  Upper Eyelid(s)  Teaching Surgeon: Greig HERO. Ashley, M.D.  Assistants: none  Anesthesia: MAC  Specimens: None.  Estimated Blood Loss: Minimal.  Complications: None.  Operative Findings: None Dictated  PROCEDURE:  Allergies were reviewed and the patient is allergic to ibuprofen..   After the risks, benefits, complications and alternatives were discussed with the patient, appropriate informed consent was obtained. While seated in an upright position and looking in primary gaze, the mid pupillary line was marked on the upper eyelid margins bilaterally. The patient was then brought to the operating suite and reclined supine.  Timeout was conducted and the patient was sedated. Local anesthetic consisting of a 50-50 mixture of 2% lidocaine  with epinephrine  and 0.75% bupivacaine  with added Hylenex  was injected subcutaneously to the bilateral  upper eyelid(s). After adequate local was instilled, the patient was prepped and draped in the usual sterile fashion for eyelid surgery.   Attention was turned to the upper eyelids. A 10mm upper eyelid crease incision line was marked with calipers on both  upper eyelid(s).  Attention was turned to the  right  upper eyelid. A #15 blade was used to open the premarked incision line and hemostasis was obtained with bipolar cautery. Westcott scissors were then used to transect through orbicularis for the length of the incision down to the tarsal plate. Epitarsus was dissected to create a smooth surface to suture to. Dissection was then carried superiorly in the plane between orbicularis and orbital septum. Once the preaponeurotic fat pocket was identified, the orbital septum was opened. This revealed the levator and its aponeurosis.    Attention  was then turned to the opposite eyelid where the same procedure was performed in the same manner.   3 interrupted 6-0 Prolene sutures were then passed partial thickness through the tarsal plates of both  upper eyelid(s). These sutures were placed in line with the mid pupillary, medial limbal, and lateral limbal lines. The sutures were fixed to the levator aponeurosis and adjusted until a nice lid height and contour were achieved. Once nice symmetry was achieved, the skin incisions were closed with a running 6-0 fast absorbing plain suture. The patient tolerated the procedure well. Erythromycin  ophthalmic ointment was applied to the incision site(s) followed by ice packs. The patient was taken to the recovery area where he recovered without difficulty.  Post-Op Plan/Instructions:  Mr. Cherubini was instructed to use ice packs frequently for the next 48 hours. His was instructed to use Erythromycin  ophthalmic ointment on his incisions 4 times a day for the next 12 to 14 days. He was given a prescription for tramadol  (or similar) for pain control should Tylenol not be effective. He was asked to to follow up in 2-3 weeks' time at the [facility] in [place], [place] or sooner as needed for problems.  Jarred Purtee M. Ashley, M.D. Ophthalmology

## 2024-05-05 NOTE — Transfer of Care (Signed)
 Immediate Anesthesia Transfer of Care Note  Patient: Jimmy Macdonald  Procedure(s) Performed: REPAIR, BLEPHAROPTOSIS (Bilateral)  Patient Location: PACU  Anesthesia Type: MAC  Level of Consciousness: awake, alert  and patient cooperative  Airway and Oxygen Therapy: Patient Spontanous Breathing and Patient connected to supplemental oxygen  Post-op Assessment: Post-op Vital signs reviewed, Patient's Cardiovascular Status Stable, Respiratory Function Stable, Patent Airway and No signs of Nausea or vomiting  Post-op Vital Signs: Reviewed and stable  Complications: No notable events documented.

## 2024-05-05 NOTE — H&P (Signed)
 [facility]: [facility]  Primary Care Physician:  Cleotilde Oneil FALCON, MD Ophthalmologist: Dr. Greig HERO. Ashley, M.D.  Pre-Procedure History & Physical: HPI:  Jimmy Macdonald is a 68 y.o. male here for periocular surgery.   Past Medical History:  Diagnosis Date   Absence of uvula    Arthritis    Cancer (HCC)    skin cancers on arm   Cough    Diabetes mellitus without complication (HCC)    TYPE 2 BORDERLINE   Environmental allergies    Hx of blood clots    RT LEG  10 YRS AGO   Hypertension    Multiple thyroid nodules    small via U/S- MD checks them- no issues   Psoriatic arthritis (HCC)    Sarcoidosis    15-16 years ago- no issues now   Sleep apnea    NASAL RECON SURGERY-COULDN'T USE CPAP   Thyroid nodule    BENIGN     Past Surgical History:  Procedure Laterality Date   ANKLE SURGERY     NASAL RECONSTRUCTION     T+A REMOVED   VIDEO BRONCHOSCOPY WITH ENDOBRONCHIAL ULTRASOUND N/A 06/13/2015   Procedure: VIDEO BRONCHOSCOPY WITH ENDOBRONCHIAL ULTRASOUND;  Surgeon: Vicenta KATHEE Lennert, MD;  Location: [facility] OR;  Service: Thoracic;  Laterality: N/A;    Prior to Admission medications   Medication Sig Start Date End Date Taking? Authorizing Provider  aspirin 81 MG tablet Take 81 mg by mouth daily.   Yes [provider]  carvedilol (COREG) 12.5 MG tablet Take 12.5 mg by mouth 2 (two) times daily with a meal.   Yes [provider]  folic acid (FOLVITE) 1 MG tablet Take 1 mg by mouth daily.   Yes [provider]  metFORMIN (GLUCOPHAGE) 500 MG tablet Take 500 mg by mouth daily with breakfast.   Yes [provider]  methotrexate (RHEUMATREX) 2.5 MG tablet Take 2.5 mg by mouth once a week. Caution:Chemotherapy. Protect from light.   Yes [provider]  mometasone (ELOCON) 0.1 % ointment Apply 1 Application topically as needed.   Yes [provider]  Multiple Vitamin (MULTIVITAMIN WITH MINERALS) TABS tablet Take 1 tablet by  mouth daily.   Yes [provider]  Multiple Vitamins-Minerals (MULTIVITAMIN WITH MINERALS) tablet Take 1 tablet by mouth daily.   Yes [provider]  olmesartan-hydrochlorothiazide (BENICAR HCT) 40-12.5 MG tablet Take 1 tablet by mouth daily.   Yes [provider]  pregabalin (LYRICA) 75 MG capsule Take 75 mg by mouth at bedtime.   Yes [provider]  simvastatin (ZOCOR) 20 MG tablet Take 1 tablet by mouth at bedtime. Reported on 11/14/2015 07/02/14  Yes [provider]  tamsulosin (FLOMAX) 0.4 MG CAPS capsule Take 0.4 mg by mouth daily after breakfast.   Yes [provider]    Allergies as of 01/06/2024 - Review Complete 08/07/2020  Allergen Reaction Noted   Ibuprofen Rash 05/22/2015    Family History  Problem Relation Age of Onset   Hypercholesterolemia Brother    Lung disease Mother     Social History   Socioeconomic History   Marital status: Married    Spouse name: Not on file   Number of children: Not on file   Years of education: Not on file   Highest education level: Not on file  Occupational History   Not on file  Tobacco Use   Smoking status: Never   Smokeless tobacco: Never  Vaping Use   Vaping status:  Never Used  Substance and Sexual Activity   Alcohol use: Yes    Comment: maybe 1 a month socially   Drug use: No   Sexual activity: Not on file  Other Topics Concern   Not on file  Social History Narrative   Not on file   Social Drivers of Health   Financial Resource Strain: Low Risk  (01/03/2024)   Received from [facility]   Overall Financial Resource Strain (CARDIA)    Difficulty of Paying Living Expenses: Not hard at all  Food Insecurity: No Food Insecurity (01/03/2024)   Received from [facility]   Hunger Vital Sign    Within the past 12 months, you worried that your food would run out before you got the money to buy more.: Never true    Within the past 12  months, the food you bought just didn't last and you didn't have money to get more.: Never true  Transportation Needs: No Transportation Needs (01/03/2024)   Received from [facility] - Transportation    In the past 12 months, has lack of transportation kept you from medical appointments or from getting medications?: No    Lack of Transportation (Non-Medical): No  Physical Activity: Not on file  Stress: Not on file  Social Connections: Not on file  Intimate Partner Violence: Not on file    Review of Systems: See HPI, otherwise negative ROS  Physical Exam: BP 136/73   Temp 98.1 F (36.7 C) (Temporal)   Resp 17   Ht 5' 7.99 (1.727 m)   Wt 92.6 kg   SpO2 96%   BMI 31.04 kg/m  General:   Alert and cooperative in NAD Head:  Normocephalic and atraumatic. Respiratory:  Normal work of breathing.  Impression/Plan: Jimmy Macdonald is here for periocular surgery.  Risks, benefits, limitations, and alternatives regarding surgery have been reviewed with the patient.  Questions have been answered.  All parties agreeable.   Ashley Greig HERO, MD  05/05/2024, 7:38 AM

## 2024-05-23 ENCOUNTER — Other Ambulatory Visit: Payer: Self-pay | Admitting: Podiatry

## 2024-05-23 DIAGNOSIS — L4050 Arthropathic psoriasis, unspecified: Secondary | ICD-10-CM

## 2024-05-23 DIAGNOSIS — M79672 Pain in left foot: Secondary | ICD-10-CM

## 2024-05-23 DIAGNOSIS — M722 Plantar fascial fibromatosis: Secondary | ICD-10-CM

## 2024-05-27 ENCOUNTER — Ambulatory Visit
Admission: RE | Admit: 2024-05-27 | Discharge: 2024-05-27 | Disposition: A | Discharge status: Home / Self Care | Source: Ambulatory Visit | Attending: Podiatry | Admitting: Podiatry

## 2024-05-27 DIAGNOSIS — L4050 Arthropathic psoriasis, unspecified: Secondary | ICD-10-CM

## 2024-05-27 DIAGNOSIS — M79672 Pain in left foot: Secondary | ICD-10-CM

## 2024-05-27 DIAGNOSIS — M722 Plantar fascial fibromatosis: Secondary | ICD-10-CM

## 2024-07-18 ENCOUNTER — Other Ambulatory Visit: Payer: Self-pay | Admitting: Podiatry

## 2024-07-28 ENCOUNTER — Encounter: Payer: Self-pay | Admitting: Podiatry

## 2024-07-31 NOTE — Discharge Instructions (Signed)

## 2024-07-31 NOTE — Anesthesia Preprocedure Evaluation (Signed)
 Anesthesia Evaluation  Patient identified by MRN, date of birth, ID band Patient awake    Reviewed: Allergy & Precautions, H&P , NPO status , Patient's Chart, lab work & pertinent test results  Airway Mallampati: IV  TM Distance: >3 FB    Comment:   Comment: Had surgery for sleep apnea, nose, uvula removed, tonsillectomy, still snores but denies sleep apnea now. States did NOT have jaw advanced. Thick neck.   Dental no notable dental hx.    Pulmonary neg pulmonary ROS, sleep apnea           Cardiovascular hypertension, negative cardio ROS      Neuro/Psych negative neurological ROS  negative psych ROS   GI/Hepatic negative GI ROS, Neg liver ROS,,,  Endo/Other  negative endocrine ROSdiabetes    Renal/GU negative Renal ROS  negative genitourinary   Musculoskeletal negative musculoskeletal ROS (+) Arthritis ,    Abdominal   Peds negative pediatric ROS (+)  Hematology negative hematology ROS (+)   Anesthesia Other Findings Had blepharoplasty 05-05-24 Dr. Ola  Medical History  Hypertension  Environmental allergies Psoriatic arthritis (HCC)  Hx of blood clots Cough  Thyroid nodule Arthritis  Sleep apnea Diabetes mellitus without complication (HCC) Cancer (HCC) Sarcoidosis  Multiple thyroid nodules Absence of uvula     Reproductive/Obstetrics negative OB ROS                              Anesthesia Physical Anesthesia Plan  ASA: 3  Anesthesia Plan: General   Post-op Pain Management:    Induction: Intravenous  PONV Risk Score and Plan:   Airway Management Planned: Natural Airway and Nasal Cannula  Additional Equipment:   Intra-op Plan:   Post-operative Plan: Extubation in OR  Informed Consent: I have reviewed the patients History and Physical, chart, labs and discussed the procedure including the risks, benefits and alternatives for the proposed anesthesia with the  patient or authorized representative who has indicated his/her understanding and acceptance.     Dental Advisory Given  Plan Discussed with: Anesthesiologist, CRNA and Surgeon  Anesthesia Plan Comments: (Patient consented for risks of anesthesia including but not limited to:  - adverse reactions to medications - risk of airway placement if required - damage to eyes, teeth, lips or other oral mucosa - nerve damage due to positioning  - sore throat or hoarseness - Damage to heart, brain, nerves, lungs, other parts of body or loss of life  Patient voiced understanding and assent.)         Anesthesia Quick Evaluation

## 2024-08-02 ENCOUNTER — Encounter: Admission: RE | Disposition: A | Payer: Self-pay | Source: Home / Self Care | Attending: Podiatry

## 2024-08-02 ENCOUNTER — Encounter: Payer: Self-pay | Admitting: Podiatry

## 2024-08-02 ENCOUNTER — Ambulatory Visit: Payer: Self-pay | Admitting: Anesthesiology

## 2024-08-02 ENCOUNTER — Ambulatory Visit: Admission: RE | Admit: 2024-08-02 | Discharge: 2024-08-02 | Disposition: A | Attending: Podiatry | Admitting: Podiatry

## 2024-08-02 ENCOUNTER — Other Ambulatory Visit: Payer: Self-pay

## 2024-08-02 DIAGNOSIS — G4730 Sleep apnea, unspecified: Secondary | ICD-10-CM | POA: Insufficient documentation

## 2024-08-02 DIAGNOSIS — I10 Essential (primary) hypertension: Secondary | ICD-10-CM | POA: Insufficient documentation

## 2024-08-02 DIAGNOSIS — M1990 Unspecified osteoarthritis, unspecified site: Secondary | ICD-10-CM | POA: Insufficient documentation

## 2024-08-02 DIAGNOSIS — M722 Plantar fascial fibromatosis: Secondary | ICD-10-CM | POA: Diagnosis present

## 2024-08-02 DIAGNOSIS — L4050 Arthropathic psoriasis, unspecified: Secondary | ICD-10-CM | POA: Diagnosis present

## 2024-08-02 DIAGNOSIS — G5752 Tarsal tunnel syndrome, left lower limb: Secondary | ICD-10-CM | POA: Diagnosis not present

## 2024-08-02 DIAGNOSIS — M79672 Pain in left foot: Secondary | ICD-10-CM | POA: Diagnosis present

## 2024-08-02 HISTORY — PX: NERVE REPAIR: SHX2083

## 2024-08-02 HISTORY — PX: PLANTAR FASCIA RELEASE: SHX2239

## 2024-08-02 LAB — GLUCOSE, CAPILLARY: Glucose-Capillary: 120 mg/dL — ABNORMAL HIGH (ref 70–99)

## 2024-08-02 SURGERY — FASCIOTOMY, PLANTAR, ENDOSCOPIC
Anesthesia: General | Site: Foot | Laterality: Left

## 2024-08-02 MED ORDER — MIDAZOLAM HCL 5 MG/5ML IJ SOLN
INTRAMUSCULAR | Status: DC | PRN
  Administered 2024-08-02: 12:00:00 2 mg via INTRAVENOUS

## 2024-08-02 MED ORDER — BUPIVACAINE LIPOSOME 1.3 % IJ SUSP
INTRAMUSCULAR | Status: DC | PRN
  Administered 2024-08-02: 12:00:00 10 mL

## 2024-08-02 MED ORDER — ONDANSETRON HCL 4 MG PO TABS: 4.0000 mg | ORAL_TABLET | Freq: Four times a day (QID) | ORAL | Status: DC | PRN

## 2024-08-02 MED ORDER — EPHEDRINE SULFATE (PRESSORS) 25 MG/5ML IV SOSY
PREFILLED_SYRINGE | INTRAVENOUS | Status: DC | PRN
  Administered 2024-08-02 (×2): 10 mg via INTRAVENOUS

## 2024-08-02 MED ORDER — LIDOCAINE HCL (CARDIAC) PF 100 MG/5ML IV SOSY
PREFILLED_SYRINGE | INTRAVENOUS | Status: DC | PRN
  Administered 2024-08-02: 12:00:00 50 mg via INTRATRACHEAL

## 2024-08-02 MED ORDER — BUPIVACAINE-EPINEPHRINE (PF) 0.25% -1:200000 IJ SOLN
INTRAMUSCULAR | Status: DC | PRN
  Administered 2024-08-02: 12:00:00 10 mL via PERINEURAL

## 2024-08-02 MED ORDER — ONDANSETRON HCL 4 MG/2ML IJ SOLN: 4.0000 mg | Freq: Four times a day (QID) | INTRAMUSCULAR | Status: DC | PRN

## 2024-08-02 MED ORDER — METOCLOPRAMIDE HCL 5 MG PO TABS: 5.0000 mg | ORAL_TABLET | Freq: Three times a day (TID) | ORAL | Status: DC | PRN

## 2024-08-02 MED ORDER — OXYCODONE-ACETAMINOPHEN 5-325 MG PO TABS: 1.0000 | ORAL_TABLET | Freq: Four times a day (QID) | ORAL | 0 refills | Status: AC | PRN

## 2024-08-02 MED ORDER — CEFAZOLIN SODIUM-DEXTROSE 2-4 GM/100ML-% IV SOLN
2.0000 g | INTRAVENOUS | Status: AC
  Administered 2024-08-02: 12:00:00 2 g via INTRAVENOUS

## 2024-08-02 MED ORDER — MIDAZOLAM HCL 2 MG/2ML IJ SOLN
INTRAMUSCULAR | Status: AC
  Filled 2024-08-02: qty 2

## 2024-08-02 MED ORDER — SEVOFLURANE IN SOLN
RESPIRATORY_TRACT | Status: AC
  Filled 2024-08-02: qty 250

## 2024-08-02 MED ORDER — METOCLOPRAMIDE HCL 5 MG/ML IJ SOLN: 5.0000 mg | Freq: Three times a day (TID) | INTRAMUSCULAR | Status: DC | PRN

## 2024-08-02 MED ORDER — ONDANSETRON HCL 4 MG/2ML IJ SOLN
INTRAMUSCULAR | Status: DC | PRN
  Administered 2024-08-02: 12:00:00 4 mg via INTRAVENOUS

## 2024-08-02 MED ORDER — LACTATED RINGERS IV SOLN: INTRAVENOUS | Status: DC

## 2024-08-02 MED ADMIN — Fentanyl Citrate Preservative Free (PF) Inj 100 MCG/2ML: 100 ug | INTRAVENOUS | @ 12:00:00 | NDC 72572017025

## 2024-08-02 MED ADMIN — PROPOFOL 200 MG/20ML IV EMUL: 150 mg | INTRAVENOUS | @ 12:00:00 | NDC 00069020910

## 2024-08-02 MED FILL — Cefazolin Sodium for IV Soln 2 GM and Dextrose 3% (50 ML): INTRAVENOUS | Qty: 50 | Status: AC

## 2024-08-02 MED FILL — Fentanyl Citrate Preservative Free (PF) Inj 100 MCG/2ML: INTRAMUSCULAR | Qty: 2 | Status: AC

## 2024-08-02 SURGICAL SUPPLY — 43 items
BENZOIN TINCTURE PRP APPL 2/3 (GAUZE/BANDAGES/DRESSINGS) IMPLANT
BNDG COHESIVE 4X5 TAN STRL LF (GAUZE/BANDAGES/DRESSINGS) ×1 IMPLANT
BNDG ESMARCH 4X12 STRL LF (GAUZE/BANDAGES/DRESSINGS) ×1 IMPLANT
BNDG GAUZE DERMACEA FLUFF 4 (GAUZE/BANDAGES/DRESSINGS) ×1 IMPLANT
BNDG STRETCH 4X75 STRL LF (GAUZE/BANDAGES/DRESSINGS) ×1 IMPLANT
BOOT WALKER MEDIUM (SOFTGOODS) IMPLANT
CANISTER SUCT 1200ML W/VALVE (MISCELLANEOUS) ×1 IMPLANT
COVER LIGHT HANDLE UNIVERSAL (MISCELLANEOUS) ×2 IMPLANT
CUFF TOURN SGL QUICK 18X4 (TOURNIQUET CUFF) IMPLANT
CUFF TRNQT CYL 24X4X16.5-23 (TOURNIQUET CUFF) IMPLANT
DRAPE C-ARMOR (DRAPES) IMPLANT
DURAPREP 26ML APPLICATOR (WOUND CARE) ×1 IMPLANT
ELECTRODE REM PT RTRN 9FT ADLT (ELECTROSURGICAL) ×1 IMPLANT
GAUZE SPONGE 4X4 12PLY STRL (GAUZE/BANDAGES/DRESSINGS) IMPLANT
GAUZE XEROFORM 1X8 LF (GAUZE/BANDAGES/DRESSINGS) ×1 IMPLANT
GAUZE XEROFORM 5X9 LF (GAUZE/BANDAGES/DRESSINGS) ×1 IMPLANT
GLOVE SURG SS PI 7.5 STRL IVOR (GLOVE) ×1 IMPLANT
GLOVE SURG UNDER LTX SZ8 (GLOVE) ×1 IMPLANT
GOWN STRL REUS W/ TWL LRG LVL3 (GOWN DISPOSABLE) ×2 IMPLANT
IV NS 250ML BAXH (IV SOLUTION) IMPLANT
KIT PRC PRB RTRGD 3ANG KNF HND (MISCELLANEOUS) ×1 IMPLANT
KIT TURNOVER KIT A (KITS) ×1 IMPLANT
KWIRE DBL END TROCAR 6X.062 (WIRE) IMPLANT
NDL 18GX1X1/2 (RX/OR ONLY) (NEEDLE) IMPLANT
NDL HYPO 25GX1X1/2 BEV (NEEDLE) IMPLANT
NEEDLE 18GX1X1/2 (RX/OR ONLY) (NEEDLE) IMPLANT
NEEDLE HYPO 25GX1X1/2 BEV (NEEDLE) IMPLANT
NS IRRIG 500ML POUR BTL (IV SOLUTION) ×1 IMPLANT
PACK EXTREMITY ARMC (MISCELLANEOUS) ×1 IMPLANT
PENCIL SMOKE EVACUATOR (MISCELLANEOUS) ×1 IMPLANT
SOLUTION ANTFG W/FOAM PAD STRL (MISCELLANEOUS) IMPLANT
SPLINT CAST 1 STEP 4X30 (MISCELLANEOUS) IMPLANT
STOCKINETTE IMPERVIOUS LG (DRAPES) ×1 IMPLANT
STRIP CLOSURE SKIN 1/4X4 (GAUZE/BANDAGES/DRESSINGS) IMPLANT
SUT ETHILON 5-0 FS-2 18 BLK (SUTURE) IMPLANT
SUT NYLON 3 0 (SUTURE) IMPLANT
SUT VIC AB 4-0 FS2 27 (SUTURE) IMPLANT
SUT VICRYL AB 3-0 FS1 BRD 27IN (SUTURE) IMPLANT
SUTURE ETHLN 4-0 FS2 18XMF BLK (SUTURE) IMPLANT
SUTURE MNCRL 4-0 27XMF (SUTURE) IMPLANT
SYR 10ML LL (SYRINGE) IMPLANT
SYR 5ML LL (SYRINGE) IMPLANT
WAND TOPAZ MICRO DEBRIDER (MISCELLANEOUS) IMPLANT

## 2024-08-02 NOTE — Anesthesia Procedure Notes (Signed)
 Procedure Name: LMA Insertion Date/Time: 08/02/2024 12:17 PM  Performed by: Levy Harvey, CRNAPre-anesthesia Checklist: Patient identified, Emergency Drugs available, Suction available, Timeout performed and Patient being monitored Patient Re-evaluated:Patient Re-evaluated prior to induction Oxygen Delivery Method: Circle system utilized Preoxygenation: Pre-oxygenation with 100% oxygen Induction Type: IV induction LMA: LMA inserted LMA Size: 5.0 Number of attempts: 1 Placement Confirmation: positive ETCO2 and breath sounds checked- equal and bilateral Tube secured with: Tape

## 2024-08-02 NOTE — Anesthesia Postprocedure Evaluation (Signed)
 Anesthesia Post Note  Patient: Jimmy Macdonald  Procedure(s) Performed: FASCIOTOMY, PLANTAR, ENDOSCOPIC (Left: Foot) REPAIR, NERVE (Left: Foot)  Patient location during evaluation: PACU Anesthesia Type: General Level of consciousness: awake and alert Pain management: pain level controlled Vital Signs Assessment: post-procedure vital signs reviewed and stable Respiratory status: spontaneous breathing, nonlabored ventilation, respiratory function stable and patient connected to nasal cannula oxygen Cardiovascular status: blood pressure returned to baseline and stable Postop Assessment: no apparent nausea or vomiting Anesthetic complications: no   No notable events documented.   Last Vitals:  Vitals:   08/02/24 1330 08/02/24 1337  BP: 135/73 137/73  Pulse: 67 64  Resp: 13 12  Temp:  (!) 36.3 C  SpO2: 95% 96%    Last Pain:  Vitals:   08/02/24 1337  TempSrc:   PainSc: 0-No pain                 Joncarlos Atkison C Dedra Matsuo

## 2024-08-02 NOTE — H&P (Signed)
 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  The various methods of treatment have been discussed with the patient.  No guarantees were given.  After consideration of risks, benefits and other options for treatment, the patient has consented to surgery.  I have reviewed the patients chart and labs.     A history and physical examination was performed in my office.  The patient was reexamined.  There have been no changes to this history and physical examination.  Ashley Soulier A

## 2024-08-02 NOTE — Transfer of Care (Signed)
 Immediate Anesthesia Transfer of Care Note  Patient: Jimmy Macdonald  Procedure(s) Performed: FASCIOTOMY, PLANTAR, ENDOSCOPIC (Left: Foot) REPAIR, NERVE (Left: Foot)  Patient Location: PACU  Anesthesia Type: General  Level of Consciousness: awake, alert  and patient cooperative  Airway and Oxygen Therapy: Patient Spontanous Breathing and Patient connected to supplemental oxygen  Post-op Assessment: Post-op Vital signs reviewed, Patient's Cardiovascular Status Stable, Respiratory Function Stable, Patent Airway and No signs of Nausea or vomiting  Post-op Vital Signs: Reviewed and stable  Complications: No notable events documented.

## 2024-08-02 NOTE — Op Note (Signed)
 Operative note   Surgeon:Jourdin Gens [employer]/support/administrative Officer: None    Preop diagnosis: 1.  Plantar fasciitis left heel 2.  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  Endoscopic plantar fasciotomy left heel 2.  Distal tarsal tunnel release left medial heel    EBL: Minimal    Anesthesia: General With local.  Local consists of a total of 20 cc of one-to-one mixture of 0.25% plain bupivacaine  and Exparel  long-acting anesthetic and a local block fashion.    Hemostasis: Mid calf tourniquet inflated to 200 mmHg for 25 minutes    Specimen: None    Complications: None    Operative indications:Jimmy Macdonald is an 68 y.o. that presents today for surgical intervention.  The risks/benefits/alternatives/complications have been discussed and consent has been given.    Procedure:  Patient was brought into the OR and placed on the operating table in thesupine position. After anesthesia was obtained theleft lower extremity was prepped and draped in usual sterile fashion.  Attention was directed to the plantar medial left heel where a small stab incision was made at approximately 2 cm from plantar and 5 cm from posterior heel region.  Blunt dissection carried down to the plantar fascial ligament.  A fascial elevator was then introduced.  Next the blunt trocar and cannula was introduced from medial to lateral and a small stab incision was made laterally.  The endoscope was used to visualize the plantar fascia which was seen at this time.  Next the medial one half of the plantar fascial ligament was released with a diamond blade.  The deep musculature was noted after the release.  The wound was flushed with copious amounts of irrigation.  The instrumentation was removed.  The medial incision was then increased in size along the medial aspect of the heel.  The superficial fascia was noted.  The musculature was then noted deep to this area.  This was reflected both  proximal and distal and the deep fascial layer was noted.  At this level of the Baxters nerve adipose tissue was visualized and all of the deep fascial layer was released around this.  Blunt probing was used and no further fibrotic tissue was found.  The incision was then flushed with copious amounts irrigation.  Closure was performed with a 4-0 Vicryl for the subcutaneous tissue and a 3-0 nylon for the skin.  A bulky sterile dressing was applied and a equalizer walker boot was then applied with the foot in a neutral 90 degree position.    Patient tolerated the procedure and anesthesia well.  Was transported from the OR to the PACU with all vital signs stable and vascular status intact. To be discharged per routine protocol.  Will follow up in approximately 1 week in the outpatient clinic.
